# Patient Record
Sex: Female | Born: 1975 | Race: White | Hispanic: No | Marital: Married | Smoking: Never smoker
Health system: Southern US, Community
[De-identification: ages and names within clinical notes are randomized; demographics above are authoritative.]

## PROBLEM LIST (undated history)

## (undated) DIAGNOSIS — M51379 Other intervertebral disc degeneration, lumbosacral region without mention of lumbar back pain or lower extremity pain: Secondary | ICD-10-CM

## (undated) DIAGNOSIS — M5134 Other intervertebral disc degeneration, thoracic region: Secondary | ICD-10-CM

## (undated) DIAGNOSIS — I10 Essential (primary) hypertension: Secondary | ICD-10-CM

## (undated) HISTORY — PX: ABDOMINAL HYSTERECTOMY: SHX81

## (undated) HISTORY — PX: TONSILLECTOMY: SUR1361

---

## 2004-05-20 ENCOUNTER — Ambulatory Visit: Payer: Self-pay | Admitting: Orthopedic Surgery

## 2005-04-10 ENCOUNTER — Emergency Department: Payer: Self-pay | Admitting: General Practice

## 2005-04-17 ENCOUNTER — Ambulatory Visit: Payer: Self-pay

## 2006-12-20 ENCOUNTER — Emergency Department: Payer: Self-pay | Admitting: Emergency Medicine

## 2007-01-13 ENCOUNTER — Ambulatory Visit: Payer: Self-pay | Admitting: Family Medicine

## 2007-01-13 ENCOUNTER — Emergency Department: Payer: Self-pay | Admitting: Emergency Medicine

## 2007-01-14 ENCOUNTER — Emergency Department: Payer: Self-pay | Admitting: Emergency Medicine

## 2007-01-19 ENCOUNTER — Ambulatory Visit: Payer: Self-pay | Admitting: Family Medicine

## 2007-07-31 ENCOUNTER — Other Ambulatory Visit: Payer: Self-pay

## 2007-07-31 ENCOUNTER — Emergency Department: Payer: Self-pay | Admitting: Emergency Medicine

## 2007-11-10 ENCOUNTER — Ambulatory Visit: Payer: Self-pay | Admitting: Family Medicine

## 2007-12-05 ENCOUNTER — Emergency Department: Payer: Self-pay | Admitting: Emergency Medicine

## 2008-03-22 ENCOUNTER — Emergency Department: Payer: Self-pay | Admitting: Emergency Medicine

## 2008-08-27 ENCOUNTER — Emergency Department: Payer: Self-pay | Admitting: Emergency Medicine

## 2008-10-30 ENCOUNTER — Ambulatory Visit: Payer: Self-pay | Admitting: Family Medicine

## 2008-11-28 ENCOUNTER — Ambulatory Visit: Payer: Self-pay | Admitting: Family Medicine

## 2008-11-29 ENCOUNTER — Ambulatory Visit: Payer: Self-pay | Admitting: Family Medicine

## 2009-02-11 ENCOUNTER — Emergency Department: Payer: Self-pay | Admitting: Emergency Medicine

## 2010-01-31 ENCOUNTER — Emergency Department: Payer: Self-pay | Admitting: Internal Medicine

## 2010-02-20 ENCOUNTER — Ambulatory Visit: Payer: Self-pay | Admitting: Family Medicine

## 2010-04-02 ENCOUNTER — Emergency Department: Payer: Self-pay | Admitting: Emergency Medicine

## 2011-06-30 ENCOUNTER — Emergency Department: Payer: Self-pay | Admitting: Unknown Physician Specialty

## 2011-08-18 DIAGNOSIS — N946 Dysmenorrhea, unspecified: Secondary | ICD-10-CM | POA: Insufficient documentation

## 2011-10-27 ENCOUNTER — Encounter: Payer: Self-pay | Admitting: Obstetrics and Gynecology

## 2011-11-12 ENCOUNTER — Encounter: Payer: Self-pay | Admitting: Obstetrics and Gynecology

## 2012-01-02 ENCOUNTER — Emergency Department: Payer: Self-pay | Admitting: Emergency Medicine

## 2012-01-02 LAB — URINALYSIS, COMPLETE
Bilirubin,UR: NEGATIVE
Blood: NEGATIVE
Nitrite: NEGATIVE
Ph: 5 (ref 4.5–8.0)
Specific Gravity: 1.023 (ref 1.003–1.030)
WBC UR: 1 /HPF (ref 0–5)

## 2013-03-15 ENCOUNTER — Emergency Department: Payer: Self-pay | Admitting: Emergency Medicine

## 2015-07-27 DIAGNOSIS — R1013 Epigastric pain: Secondary | ICD-10-CM | POA: Insufficient documentation

## 2016-01-17 DIAGNOSIS — K449 Diaphragmatic hernia without obstruction or gangrene: Secondary | ICD-10-CM | POA: Insufficient documentation

## 2016-01-17 DIAGNOSIS — E7800 Pure hypercholesterolemia, unspecified: Secondary | ICD-10-CM | POA: Insufficient documentation

## 2016-01-17 DIAGNOSIS — I10 Essential (primary) hypertension: Secondary | ICD-10-CM | POA: Insufficient documentation

## 2016-01-17 DIAGNOSIS — K219 Gastro-esophageal reflux disease without esophagitis: Secondary | ICD-10-CM | POA: Insufficient documentation

## 2016-04-20 ENCOUNTER — Encounter: Payer: Self-pay | Admitting: *Deleted

## 2016-04-20 ENCOUNTER — Emergency Department: Payer: 59

## 2016-04-20 ENCOUNTER — Emergency Department
Admission: EM | Admit: 2016-04-20 | Discharge: 2016-04-20 | Disposition: A | Discharge status: Home / Self Care | Payer: 59 | Attending: Emergency Medicine | Admitting: Emergency Medicine

## 2016-04-20 DIAGNOSIS — I10 Essential (primary) hypertension: Secondary | ICD-10-CM | POA: Diagnosis not present

## 2016-04-20 DIAGNOSIS — F17200 Nicotine dependence, unspecified, uncomplicated: Secondary | ICD-10-CM | POA: Diagnosis not present

## 2016-04-20 DIAGNOSIS — M25511 Pain in right shoulder: Secondary | ICD-10-CM | POA: Insufficient documentation

## 2016-04-20 HISTORY — DX: Essential (primary) hypertension: I10

## 2016-04-20 MED ORDER — OXYCODONE-ACETAMINOPHEN 5-325 MG PO TABS: 1.0000 | ORAL_TABLET | ORAL | 0 refills | Status: DC | PRN

## 2016-04-20 MED ORDER — OXYCODONE-ACETAMINOPHEN 5-325 MG PO TABS
1.0000 | ORAL_TABLET | Freq: Once | ORAL | Status: AC
  Administered 2016-04-20: 1 via ORAL
  Filled 2016-04-20: qty 1

## 2016-04-20 MED ORDER — ETODOLAC 400 MG PO TABS: 400.0000 mg | ORAL_TABLET | Freq: Two times a day (BID) | ORAL | 0 refills | Status: DC

## 2016-04-20 MED ORDER — KETOROLAC TROMETHAMINE 30 MG/ML IJ SOLN
30.0000 mg | Freq: Once | INTRAMUSCULAR | Status: AC
  Administered 2016-04-20: 30 mg via INTRAMUSCULAR
  Filled 2016-04-20: qty 1

## 2016-04-20 NOTE — Discharge Instructions (Signed)
Follow-up with your primary care doctor if any continued problems or Dr. Rosita KeaMenz who is the orthopedist on call this weekend. Continue medication as directed. Etodolac 400 mg twice a day with food and Percocet as needed for severe pain. Take ice packs or heat to your shoulder frequently today.

## 2016-04-20 NOTE — ED Notes (Signed)
NAD noted at time of D/C. Pt denies questions or concerns. Pt ambulatory to the lobby at this time.  

## 2016-04-20 NOTE — ED Provider Notes (Signed)
[facility] Emergency Department Provider Note  ____________________________________________   First MD Initiated Contact with Patient 04/20/16 [phone]     (approximate)  I have reviewed the triage vital signs and the nursing notes.   HISTORY  Chief Complaint Shoulder Pain   HPI Tracy Robertson is a 40 y.o. female is complaint of shoulder pain. Patient states that she began having right shoulder pain when she woke up this morning. She states the pain radiates down her right arm and that her fingers feel slightly numb. 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 She denies any injury to her shoulder or problems in the past. She has not taken any over-the-counter medication for this.She denies any neck pain, chest pain, fever, chills, shortness of breath. Range of motion increases her pain. Patient states that holding her arm down decreases it always slightly. Currently she rates her pain as a 6/10.   Past Medical History:  Diagnosis Date  . Hypertension     There are no active problems to display for this patient.   No past surgical history on file.  Prior to Admission medications   Medication Sig Start Date End Date Taking? Authorizing Provider  oxyCODONE-acetaminophen (PERCOCET) 5-325 MG tablet Take 1 tablet by mouth every 4 (four) hours as needed for severe pain. 04/20/16   Tommi Rumpshonda L Venna Berberich, PA-C    Allergies Review of patient's allergies indicates no known allergies.  No family history on file.  Social History Social History  Substance Use Topics  . Smoking status: Never Smoker  . Smokeless tobacco: Current User  . Alcohol use No    Review of Systems Constitutional: No fever/chills ENT: No sore throat. Cardiovascular: Denies chest pain. Respiratory: Denies shortness of breath. Gastrointestinal:   No nausea, no vomiting.  Musculoskeletal: Negative for back pain. Positive right shoulder pain. Skin: Negative for  rash. Neurological: Negative for headaches. Positive for right arm radiculopathy.  10-point ROS otherwise negative.  ____________________________________________   PHYSICAL EXAM:  VITAL SIGNS: ED Triage Vitals  Enc Vitals Group     BP 04/20/16 0939 (!) 153/100     Pulse Rate 04/20/16 0939 87     Resp 04/20/16 0939 18     Temp 04/20/16 0939 98.4 F (36.9 C)     Temp Source 04/20/16 0939 Oral     SpO2 04/20/16 0939 95 %     Weight 04/20/16 0937 200 lb (90.7 kg)     Height 04/20/16 0937 5\' 4"  (1.626 m)     Head Circumference --      Peak Flow --      Pain Score 04/20/16 0937 6     Pain Loc --      Pain Edu? --      Excl. in GC? --     Constitutional: Alert and oriented. Well appearing and in no acute distress. Eyes: Conjunctivae are normal. PERRL. EOMI. Head: Atraumatic. Nose: No congestion/rhinnorhea. Neck: No stridor.  No cervical tenderness on palpation posteriorly. No difficulty with range of motion or restriction. Cardiovascular: Normal rate, regular rhythm. Grossly normal heart sounds.  Good peripheral circulation. Respiratory: Normal respiratory effort.  No retractions. Lungs CTAB. Musculoskeletal: On examination of the right shoulder there is no gross deformity. There is moderate tenderness on palpation of the right trapezius muscle and rhomboid. There is no crepitus noted with range of motion however range of motion is moderately restricted secondary to pain. There is limited abduction secondary to pain. Elbow and wrist is  nontender and no edema was present. Motor sensory function was intact. Neurologic:  Normal speech and language. No gross focal neurologic deficits are appreciated. No gait instability. Skin:  Skin is warm, dry and intact. No rash noted. Psychiatric: Mood and affect are normal. Speech and behavior are normal.  ____________________________________________   LABS (all labs ordered are listed, but only abnormal results are displayed)  Labs Reviewed  - No data to display  RADIOLOGY  Right shoulder x-ray per radiologist is negative for fracture or dislocation. ____________________________________________   PROCEDURES  Procedure(s) performed: None  Procedures  Critical Care performed: No  ____________________________________________   INITIAL IMPRESSION / ASSESSMENT AND PLAN / ED COURSE  Pertinent labs & imaging results that were available during my care of the patient were reviewed by me and considered in my medical decision making (see chart for details).    Clinical Course  Before leaving patient was feeling much better. Patient was given Toradol 30 mg IM in the emergency room along with Percocet. Patient is follow-up with Dr. Rosita Kea or to primary if any continued problems with her shoulder. She is to continue with Percocet as needed for pain. Etodolac was discontinued secondary to patient mentioning that she does have a history of stomach ulcers.   ____________________________________________   FINAL CLINICAL IMPRESSION(S) / ED DIAGNOSES  Final diagnoses:  Acute pain of right shoulder      NEW MEDICATIONS STARTED DURING THIS VISIT:  Discharge Medication List as of 04/20/2016 11:41 AM    START taking these medications   Details  etodolac (LODINE) 400 MG tablet Take 1 tablet (400 mg total) by mouth 2 (two) times daily., Starting Sun 04/20/2016, Print    oxyCODONE-acetaminophen (PERCOCET) 5-325 MG tablet Take 1 tablet by mouth every 4 (four) hours as needed for severe pain., Starting Sun 04/20/2016, Print         Note:  This document was prepared using Dragon voice recognition software and may include unintentional dictation errors.    Tommi Rumps, PA-C 04/20/16 1523    Arnaldo Natal, MD 04/24/16 [phone]

## 2016-04-20 NOTE — ED Triage Notes (Signed)
Pt woke up right shoulder pain radiating down arm, pt denies injury

## 2016-11-25 DIAGNOSIS — R202 Paresthesia of skin: Secondary | ICD-10-CM | POA: Insufficient documentation

## 2016-11-25 DIAGNOSIS — M62838 Other muscle spasm: Secondary | ICD-10-CM | POA: Insufficient documentation

## 2016-11-25 DIAGNOSIS — R200 Anesthesia of skin: Secondary | ICD-10-CM | POA: Insufficient documentation

## 2016-12-22 DIAGNOSIS — E669 Obesity, unspecified: Secondary | ICD-10-CM | POA: Insufficient documentation

## 2016-12-29 ENCOUNTER — Ambulatory Visit
Admission: EM | Admit: 2016-12-29 | Discharge: 2016-12-29 | Disposition: A | Discharge status: Home / Self Care | Payer: 59 | Attending: Emergency Medicine | Admitting: Emergency Medicine

## 2016-12-29 ENCOUNTER — Ambulatory Visit (INDEPENDENT_AMBULATORY_CARE_PROVIDER_SITE_OTHER): Payer: 59

## 2016-12-29 DIAGNOSIS — S8391XA Sprain of unspecified site of right knee, initial encounter: Secondary | ICD-10-CM

## 2016-12-29 DIAGNOSIS — M25561 Pain in right knee: Secondary | ICD-10-CM | POA: Diagnosis not present

## 2016-12-29 DIAGNOSIS — S86911A Strain of unspecified muscle(s) and tendon(s) at lower leg level, right leg, initial encounter: Secondary | ICD-10-CM | POA: Diagnosis not present

## 2016-12-29 NOTE — Discharge Instructions (Signed)
Rest.. Ice. Elevate. Wear splint. Avoid strenuous activity. Stretch. Gradually increase activity as tolerated.    Follow up with your primary care physician or orthopedic this week as needed. Return to Urgent care for new or worsening concerns.

## 2016-12-29 NOTE — ED Triage Notes (Signed)
Patient complains of right knee pain. Patient states that she has been Saturday evening, worsening last night. Patient states that she has been aggressively working out and feels like her knee is popping. Patient states that she has never had knee pain before.

## 2016-12-29 NOTE — ED Provider Notes (Signed)
[facility] ____________________________________________  Time seen: Approximately 6:28 PM  I have reviewed the triage vital signs and the nursing notes.   HISTORY  Chief Complaint Knee Pain (Right)  HPI Tracy Robertson is a 41 y.o. female  presents for evaluation of right knee pain is been present for the last 2-3 days. Patient reports Saturday she felt a slight pain to her knee, but reports pain was definitely present Sunday when she woke up and has continued. Denies any acute worsening today of pain. States pain is focused to the right side of her knee. Denies any fall, direct injury or known injury. States over last week she has increased her physical activity. Reports she has been recently walking but over the last week she has started to run. Patient reports she's been exercising a lot in the last 1-2 weeks. States that she does have some clicking popping sensation to the right knee with active weightbearing. Denies any decreased range of motion, redness, skin changes or insect bite. Denies any posterior knee or calf or lower leg pain. Denies pain radiation, paresthesias or decreased range of motion. Denies previous knee issues. States pain at this time is mild. States that she does take daily Celebrex for chronic neck issues.   Denies chest pain, shortness of breath, abdominal pain, dysuria, or rash. Denies recent sickness. Denies recent antibiotic use.    Past Medical History:  Diagnosis Date  . Hypertension   chronic neck and back pain.  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   Past Surgical History:  Procedure Laterality Date  . ABDOMINAL HYSTERECTOMY    . TONSILLECTOMY       No current facility-administered medications for this encounter.   Current Outpatient Prescriptions:  .  celecoxib (CELEBREX) 200 MG capsule, Take 200 mg by mouth 2 (two) times daily., Disp: , Rfl:  .  losartan (COZAAR) 25 MG  tablet, Take 25 mg by mouth daily., Disp: , Rfl:  .  pantoprazole (PROTONIX) 40 MG tablet, Take 40 mg by mouth 2 (two) times daily., Disp: , Rfl:  .  oxyCODONE-acetaminophen (PERCOCET) 5-325 MG tablet, Take 1 tablet by mouth every 4 (four) hours as needed for severe pain., Disp: 20 tablet, Rfl: 0  Allergies Patient has no known allergies.  History reviewed. No pertinent family history.  Social History Social History  Substance Use Topics  . Smoking status: Never Smoker  . Smokeless tobacco: Never Used  . Alcohol use No    Review of Systems Constitutional: No fever/chills Cardiovascular: Denies chest pain. Respiratory: Denies shortness of breath. Gastrointestinal: No abdominal pain.   Genitourinary: Negative for dysuria. Musculoskeletal: As above.  Skin: Negative for rash.   ____________________________________________   PHYSICAL EXAM:  VITAL SIGNS: ED Triage Vitals  Enc Vitals Group     BP 12/29/16 1709 126/82     Pulse Rate 12/29/16 1709 94     Resp 12/29/16 1709 18     Temp 12/29/16 1709 98.1 F (36.7 C)     Temp Source 12/29/16 1709 Oral     SpO2 12/29/16 1709 99 %     Weight 12/29/16 1705 200 lb (90.7 kg)     Height 12/29/16 1705 5\' 3"  (1.6 m)     Head Circumference --      Peak Flow --      Pain Score 12/29/16 1705 6     Pain Loc --      Pain Edu? --  Excl. in GC? --     Constitutional: Alert and oriented. Well appearing and in no acute distress. Cardiovascular: Normal rate, regular rhythm. Grossly normal heart sounds.  Good peripheral circulation. Respiratory: Normal respiratory effort without tachypnea nor retractions. Breath sounds are clear and equal bilaterally. No wheezes, rales, rhonchi. Musculoskeletal: Ambulate her with steady gait. Bilateral posterior tibialis pulses equal and easily palpated. Right anterior lateral knee mild tenderness to direct palpation, right knee with full range of motion present, mild pain with medial and lateral stress,  no pain with anterior posterior drawer test, no pain with resisted knee extension or flexion, no posterior knee tenderness, no calf tenderness, negative Homans sign, skin intact, no erythema or ecchymosis, minimal edema noted to right anterior lateral knee. Neurologic:  Normal speech and language.  Speech is normal. No gait instability.  Skin:  Skin is warm, dry. Psychiatric: Mood and affect are normal. Speech and behavior are normal. Patient exhibits appropriate insight and judgment   ___________________________________________   LABS (all labs ordered are listed, but only abnormal results are displayed)  Labs Reviewed - No data to display ____________________________________________   Radiology  CLINICAL DATA:  Pain for several days  EXAM: RIGHT KNEE - COMPLETE 4+ VIEW  COMPARISON:  None.  FINDINGS: Frontal, lateral, and bilateral oblique views were obtained. There is no appreciable fracture or dislocation. No appreciable joint effusion. Joint spaces appear normal. No erosive change.  IMPRESSION: No fracture or joint effusion.  No appreciable arthropathy.   Electronically Signed   By: Bretta Bang III M.D.   On: 12/29/2016 18:53  PROCEDURES Procedures   INITIAL IMPRESSION / ASSESSMENT AND PLAN / ED COURSE  Pertinent labs & imaging results that were available during my care of the patient were reviewed by me and considered in my medical decision making (see chart for details).  Well appearing. No acute distress. Right lateral knee pain, suspect right lateral knee strain due to recent increase in physical activity. Right knee x-ray per radiologist negative. Encouraged rest, ice, elevation, stretching and gradual increase activity as tolerated. Knee splint with side bars applied by RN. Again encouraged stretch and active range of motion exercises. Patient to continue taking Celebrex at home. Denies need for additional prescription medications. Follow with primary  care physician or orthopedic as needed for continued pain.  Discussed follow up with Primary care physician this week. Discussed follow up and return parameters including no resolution or any worsening concerns. Patient verbalized understanding and agreed to plan.   ____________________________________________   FINAL CLINICAL IMPRESSION(S) / ED DIAGNOSES  Final diagnoses:  Acute pain of right knee  Knee strain, right, initial encounter     New Prescriptions   No medications on file    Note: This dictation was prepared with Dragon dictation along with smaller phrase technology. Any transcriptional errors that result from this process are unintentional.         Renford Dills, NP 12/29/16 1904

## 2017-08-04 ENCOUNTER — Other Ambulatory Visit: Payer: Self-pay | Admitting: Family Medicine

## 2017-08-04 ENCOUNTER — Ambulatory Visit
Admission: RE | Admit: 2017-08-04 | Discharge: 2017-08-04 | Disposition: A | Discharge status: Home / Self Care | Payer: 59 | Source: Ambulatory Visit | Attending: Family Medicine | Admitting: Family Medicine

## 2017-08-04 DIAGNOSIS — R109 Unspecified abdominal pain: Secondary | ICD-10-CM

## 2017-08-04 DIAGNOSIS — K5730 Diverticulosis of large intestine without perforation or abscess without bleeding: Secondary | ICD-10-CM | POA: Insufficient documentation

## 2017-08-04 MED ORDER — IOPAMIDOL (ISOVUE-300) INJECTION 61%
100.0000 mL | Freq: Once | INTRAVENOUS | Status: AC | PRN
  Administered 2017-08-04: 100 mL via INTRAVENOUS

## 2017-09-08 DIAGNOSIS — M5137 Other intervertebral disc degeneration, lumbosacral region: Secondary | ICD-10-CM | POA: Insufficient documentation

## 2017-10-14 ENCOUNTER — Other Ambulatory Visit: Payer: Self-pay | Admitting: Orthopedic Surgery

## 2017-10-14 DIAGNOSIS — M545 Low back pain: Secondary | ICD-10-CM

## 2017-10-22 ENCOUNTER — Ambulatory Visit
Admission: RE | Admit: 2017-10-22 | Discharge: 2017-10-22 | Disposition: A | Discharge status: Home / Self Care | Payer: 59 | Source: Ambulatory Visit | Attending: Orthopedic Surgery | Admitting: Orthopedic Surgery

## 2017-10-22 DIAGNOSIS — M4807 Spinal stenosis, lumbosacral region: Secondary | ICD-10-CM | POA: Diagnosis not present

## 2017-10-22 DIAGNOSIS — M5136 Other intervertebral disc degeneration, lumbar region: Secondary | ICD-10-CM | POA: Diagnosis not present

## 2017-10-22 DIAGNOSIS — M47896 Other spondylosis, lumbar region: Secondary | ICD-10-CM | POA: Insufficient documentation

## 2017-10-22 DIAGNOSIS — M47897 Other spondylosis, lumbosacral region: Secondary | ICD-10-CM | POA: Diagnosis not present

## 2017-10-22 DIAGNOSIS — M5416 Radiculopathy, lumbar region: Secondary | ICD-10-CM | POA: Diagnosis not present

## 2017-10-22 DIAGNOSIS — M545 Low back pain: Secondary | ICD-10-CM | POA: Diagnosis present

## 2017-10-28 ENCOUNTER — Other Ambulatory Visit: Payer: Self-pay | Admitting: Physical Medicine and Rehabilitation

## 2017-10-28 DIAGNOSIS — M5414 Radiculopathy, thoracic region: Secondary | ICD-10-CM

## 2017-11-05 ENCOUNTER — Ambulatory Visit
Admission: RE | Admit: 2017-11-05 | Discharge: 2017-11-05 | Disposition: A | Discharge status: Home / Self Care | Payer: 59 | Source: Ambulatory Visit | Attending: Physical Medicine and Rehabilitation | Admitting: Physical Medicine and Rehabilitation

## 2017-11-05 DIAGNOSIS — M5114 Intervertebral disc disorders with radiculopathy, thoracic region: Secondary | ICD-10-CM | POA: Insufficient documentation

## 2017-11-05 DIAGNOSIS — M5414 Radiculopathy, thoracic region: Secondary | ICD-10-CM | POA: Diagnosis present

## 2017-12-14 ENCOUNTER — Other Ambulatory Visit: Payer: Self-pay | Admitting: Physical Medicine and Rehabilitation

## 2017-12-14 DIAGNOSIS — M5030 Other cervical disc degeneration, unspecified cervical region: Secondary | ICD-10-CM

## 2017-12-14 DIAGNOSIS — M5416 Radiculopathy, lumbar region: Secondary | ICD-10-CM

## 2017-12-14 DIAGNOSIS — M5414 Radiculopathy, thoracic region: Secondary | ICD-10-CM

## 2017-12-24 ENCOUNTER — Ambulatory Visit
Admission: RE | Admit: 2017-12-24 | Discharge: 2017-12-24 | Disposition: A | Discharge status: Home / Self Care | Payer: 59 | Source: Ambulatory Visit | Attending: Physical Medicine and Rehabilitation | Admitting: Physical Medicine and Rehabilitation

## 2017-12-24 DIAGNOSIS — M5414 Radiculopathy, thoracic region: Secondary | ICD-10-CM | POA: Diagnosis not present

## 2017-12-24 DIAGNOSIS — M5416 Radiculopathy, lumbar region: Secondary | ICD-10-CM | POA: Insufficient documentation

## 2017-12-24 DIAGNOSIS — M5030 Other cervical disc degeneration, unspecified cervical region: Secondary | ICD-10-CM | POA: Diagnosis present

## 2018-05-18 DIAGNOSIS — M549 Dorsalgia, unspecified: Secondary | ICD-10-CM | POA: Insufficient documentation

## 2018-05-18 DIAGNOSIS — G8929 Other chronic pain: Secondary | ICD-10-CM | POA: Insufficient documentation

## 2018-06-22 DIAGNOSIS — M2550 Pain in unspecified joint: Secondary | ICD-10-CM | POA: Insufficient documentation

## 2018-06-22 DIAGNOSIS — G894 Chronic pain syndrome: Secondary | ICD-10-CM | POA: Insufficient documentation

## 2018-08-26 DIAGNOSIS — M47816 Spondylosis without myelopathy or radiculopathy, lumbar region: Secondary | ICD-10-CM | POA: Insufficient documentation

## 2019-02-04 DIAGNOSIS — G5603 Carpal tunnel syndrome, bilateral upper limbs: Secondary | ICD-10-CM | POA: Insufficient documentation

## 2019-06-24 DIAGNOSIS — M5481 Occipital neuralgia: Secondary | ICD-10-CM | POA: Insufficient documentation

## 2019-10-25 DIAGNOSIS — Z0289 Encounter for other administrative examinations: Secondary | ICD-10-CM | POA: Insufficient documentation

## 2020-05-15 ENCOUNTER — Ambulatory Visit (INDEPENDENT_AMBULATORY_CARE_PROVIDER_SITE_OTHER): Payer: 59

## 2020-05-15 ENCOUNTER — Encounter: Payer: Self-pay | Admitting: Podiatry

## 2020-05-15 ENCOUNTER — Other Ambulatory Visit: Payer: Self-pay

## 2020-05-15 ENCOUNTER — Ambulatory Visit (INDEPENDENT_AMBULATORY_CARE_PROVIDER_SITE_OTHER): Payer: 59 | Admitting: Podiatry

## 2020-05-15 DIAGNOSIS — M7672 Peroneal tendinitis, left leg: Secondary | ICD-10-CM

## 2020-05-15 DIAGNOSIS — M7661 Achilles tendinitis, right leg: Secondary | ICD-10-CM

## 2020-05-15 DIAGNOSIS — M7662 Achilles tendinitis, left leg: Secondary | ICD-10-CM | POA: Diagnosis not present

## 2020-05-15 DIAGNOSIS — M7660 Achilles tendinitis, unspecified leg: Secondary | ICD-10-CM

## 2020-05-15 DIAGNOSIS — J4520 Mild intermittent asthma, uncomplicated: Secondary | ICD-10-CM | POA: Insufficient documentation

## 2020-05-16 ENCOUNTER — Encounter: Payer: Self-pay | Admitting: Podiatry

## 2020-05-16 NOTE — Progress Notes (Signed)
Subjective:  Patient ID: Tracy Robertson, female    DOB: 10-15-1975,  MRN: [id number]  Chief Complaint  Patient presents with  . Ankle Pain    Patient presents today for bilat ankle/heel pain x 2-3 months    44 y.o. female presents with the above complaint.  Patient presents with complaint bilateral foot and ankle pain with pain to the palpation to the right posterior ankle as well as medial lateral ankle.  Patient also has pain to the lateral ankle on the left side.  She states that this has been going on for 2 to 3 months has progressive gotten worse.  The right side is worse than left side.  Is worse in the morning and standing and throbs at night.  Patient has tried ibuprofen Tylenol Voltaren gel heat none of that has helped.  She is a self-employed and is on her foot constantly.  She denies any other acute complaints.  She would like to discuss treatment options she has not seen anyone else prior to seeing me for this.   Review of Systems: Negative except as noted in the HPI. Denies N/V/F/Ch.  Past Medical History:  Diagnosis Date  . Hypertension     Current Outpatient Medications:  .  albuterol (VENTOLIN HFA) 108 (90 Base) MCG/ACT inhaler, Inhale into the lungs., Disp: , Rfl:  .  DULoxetine (CYMBALTA) 60 MG capsule, Take by mouth., Disp: , Rfl:  .  fluticasone (FLONASE) 50 MCG/ACT nasal spray, Place into the nose., Disp: , Rfl:  .  ibuprofen (ADVIL) 800 MG tablet, Take 1 tablet by mouth every 8 (eight) hours as needed., Disp: , Rfl:  .  topiramate (TOPAMAX) 25 MG tablet, , Disp: , Rfl:  .  tretinoin (RETIN-A) 0.05 % cream, APPLY NIGHTLY TO SCAP AS DIRECTED, Disp: , Rfl:  .  acetaminophen (TYLENOL) 500 MG tablet, Take by mouth., Disp: , Rfl:  .  HYDROcodone-acetaminophen (NORCO/VICODIN) 5-325 MG tablet, Take by mouth., Disp: , Rfl:  .  losartan (COZAAR) 25 MG tablet, Take 25 mg by mouth daily., Disp: , Rfl:  .  pantoprazole (PROTONIX) 40 MG tablet, Take 40 mg by mouth 2 (two)  times daily., Disp: , Rfl:  .  spironolactone (ALDACTONE) 25 MG tablet, Take 25 mg by mouth daily., Disp: , Rfl:   Social History   Tobacco Use  Smoking Status Never Smoker  Smokeless Tobacco Never Used    No Known Allergies Objective:  There were no vitals filed for this visit. There is no height or weight on file to calculate BMI. Constitutional Well developed. Well nourished.  Vascular Dorsalis pedis pulses palpable bilaterally. Posterior tibial pulses palpable bilaterally. Capillary refill normal to all digits.  No cyanosis or clubbing noted. Pedal hair growth normal.  Neurologic Normal speech. Oriented to person, place, and time. Epicritic sensation to light touch grossly present bilaterally.  Dermatologic Nails well groomed and normal in appearance. No open wounds. No skin lesions.  Orthopedic:  Pain on palpation to the posterior insertion of the Achilles tendon.  Pain with dorsiflexion of the ankle joint.  No intra-articular ankle joint pain noted.  Pain on palpation along the course of the peroneal tendon as well as posterior tibial without pain at the insertion.  Pain with resisted inversion eversion dorsiflexion plantarflexion of the foot active and passive.  Pain on palpation along the course of the peroneal tendon including the insertion on the left side.  Pain on dorsiflexion eversion resisted.   Radiographs: 3 views of skeletally  mature adult bilateral foot: No osseous abnormalities noted.  No osteoarthritic changes noted.  Soft tissue edema/volume within normal limits Assessment:   1. Achilles tendinitis, unspecified laterality    Plan:  Patient was evaluated and treated and all questions answered.  Right Achilles tendinitis/peroneal tendinitis//posterior tibial tendinitis -I discussed with the patient in extensive detail the etiology of Achilles tendinitis peroneal tendinitis and posterior tibial tendinitis and various treatment options were discussed.  Even  though patient does not recall which foot started first or where the pain originally started given that it has been couple of months, I believe patient will benefit from aggressive immobilization to allow the soft tissue to heal appropriately.  At this time I am hoping that her pain will be more focalized after allowing to be immobilized in the cam boot.  Patient agrees with the plan would like to proceed with a cam boot to the right foot. -Cam boot was dispensed  Left peroneal tendinitis -I explained to the patient the etiology of tendinitis likely due to compensating mechanism to her from the right side and various treatment options were discussed.  I believe patient will benefit from a Tri-Lock ankle brace that she is being immobilized with a boot to the right side.  I discussed with the patient in extensive detail about shoe gear modification as well.  Patient states understanding -Tri-Lock ankle brace was dispensed  No follow-ups on file.

## 2020-06-12 ENCOUNTER — Ambulatory Visit (INDEPENDENT_AMBULATORY_CARE_PROVIDER_SITE_OTHER): Payer: 59 | Admitting: Podiatry

## 2020-06-12 ENCOUNTER — Other Ambulatory Visit: Payer: Self-pay | Admitting: Podiatry

## 2020-06-12 ENCOUNTER — Other Ambulatory Visit: Payer: Self-pay

## 2020-06-12 DIAGNOSIS — M79605 Pain in left leg: Secondary | ICD-10-CM

## 2020-06-12 DIAGNOSIS — M79604 Pain in right leg: Secondary | ICD-10-CM

## 2020-06-12 DIAGNOSIS — M7672 Peroneal tendinitis, left leg: Secondary | ICD-10-CM

## 2020-06-12 DIAGNOSIS — M79672 Pain in left foot: Secondary | ICD-10-CM

## 2020-06-12 DIAGNOSIS — M722 Plantar fascial fibromatosis: Secondary | ICD-10-CM | POA: Diagnosis not present

## 2020-06-12 DIAGNOSIS — M7661 Achilles tendinitis, right leg: Secondary | ICD-10-CM

## 2020-06-12 DIAGNOSIS — M7750 Other enthesopathy of unspecified foot: Secondary | ICD-10-CM

## 2020-06-12 DIAGNOSIS — M79671 Pain in right foot: Secondary | ICD-10-CM

## 2020-06-12 DIAGNOSIS — M7662 Achilles tendinitis, left leg: Secondary | ICD-10-CM

## 2020-06-12 MED ORDER — MELOXICAM 15 MG PO TABS: 15.0000 mg | ORAL_TABLET | Freq: Every day | ORAL | 1 refills | Status: DC

## 2020-06-16 NOTE — Progress Notes (Signed)
   HPI: 44 y.o. female presenting today for follow-up evaluation of chronic severe bilateral lower extremity pain.  Patient states that she has having pain throughout her entire feet and ankles and legs.  She was last seen in the office on 01/13/2020 and a cam boot was dispensed for the right lower extremity as well as a an ankle brace to the left lower extremity.  Patient states that she has been wearing the bracing and boot which did not help.  She continues to have severe pain.  She recently went to the emergency department for evaluation regarding the foot and ankle pain and a Medrol Dosepak was prescribed.  She completes the Dosepak today.  She presents for further treatment and evaluation  Past Medical History:  Diagnosis Date  . Hypertension      Physical Exam: General: The patient is alert and oriented x3 in no acute distress.  Dermatology: Skin is warm, dry and supple bilateral lower extremities. Negative for open lesions or macerations.  Vascular: Palpable pedal pulses bilaterally. No edema or erythema noted. Capillary refill within normal limits.  Neurological: Epicritic and protective threshold grossly intact bilaterally.   Musculoskeletal Exam: Range of motion within normal limits to all pedal and ankle joints bilateral. Muscle strength 5/5 in all groups bilateral.  Today there is pain on palpation throughout the plantar fascia bilateral, and ankle joints, medial lateral and anterior aspects.  And along the Achilles tendon bilateral.  Diffuse pain throughout the entire foot ankle and legs bilateral lower extremities.    Assessment: 1.  Acute inflammatory generalized foot, ankle, and leg pain bilateral lower extremities 2.  Plantar fasciitis bilateral 3.  Achilles tendinitis bilateral 4.  Ankle joint capsulitis bilateral   Plan of Care:  1. Patient evaluated.  2.  Discontinue the cam boot right lower extremity and ankle brace left. 3.  Compression ankle sleeve dispensed.  Wear  daily bilateral lower extremities 4.  Patient finishes the Medrol Dosepak from the ED today. 5.  Prescription for meloxicam 15 mg daily 6.  Order placed for physical therapy at [facility] 7.  Injection of 0.5 cc Celestone Soluspan injected along the plantar fascia right, and Achilles tendon left 8.  Return to clinic in 4 weeks  *Husband's name is "G". She is a Interior and spatial designer.       Felecia Shelling, DPM [facility]  Dr. Felecia Shelling, DPM    [address] [place], [place] [address]                Office [phone]  Fax [phone]

## 2020-07-10 ENCOUNTER — Ambulatory Visit: Payer: 59 | Admitting: Podiatry

## 2020-07-15 ENCOUNTER — Other Ambulatory Visit: Payer: Self-pay | Admitting: Podiatry

## 2020-07-22 NOTE — Telephone Encounter (Signed)
Please advise 

## 2020-10-23 ENCOUNTER — Other Ambulatory Visit: Payer: Self-pay | Admitting: Podiatry

## 2020-10-23 NOTE — Telephone Encounter (Signed)
Please advise 

## 2021-10-15 ENCOUNTER — Ambulatory Visit: Payer: 59 | Admitting: Podiatry

## 2021-10-22 ENCOUNTER — Ambulatory Visit: Payer: 59 | Admitting: Podiatry

## 2021-10-24 ENCOUNTER — Encounter: Payer: Self-pay | Admitting: Podiatry

## 2021-10-24 ENCOUNTER — Ambulatory Visit (INDEPENDENT_AMBULATORY_CARE_PROVIDER_SITE_OTHER): Payer: 59 | Admitting: Podiatry

## 2021-10-24 DIAGNOSIS — M7752 Other enthesopathy of left foot: Secondary | ICD-10-CM

## 2021-10-24 DIAGNOSIS — M7751 Other enthesopathy of right foot: Secondary | ICD-10-CM

## 2021-10-26 ENCOUNTER — Emergency Department
Admission: EM | Admit: 2021-10-26 | Discharge: 2021-10-26 | Disposition: A | Discharge status: Home / Self Care | Payer: 59 | Attending: Emergency Medicine | Admitting: Emergency Medicine

## 2021-10-26 DIAGNOSIS — M545 Low back pain, unspecified: Secondary | ICD-10-CM | POA: Diagnosis present

## 2021-10-26 DIAGNOSIS — I10 Essential (primary) hypertension: Secondary | ICD-10-CM | POA: Diagnosis not present

## 2021-10-26 DIAGNOSIS — B029 Zoster without complications: Secondary | ICD-10-CM | POA: Diagnosis not present

## 2021-10-26 MED ORDER — ACYCLOVIR 400 MG PO TABS: 800.0000 mg | ORAL_TABLET | Freq: Every day | ORAL | 0 refills | Status: DC

## 2021-10-26 MED ORDER — TRIAMCINOLONE ACETONIDE 0.1 % EX OINT: 1.0000 "application " | TOPICAL_OINTMENT | Freq: Two times a day (BID) | CUTANEOUS | 1 refills | Status: DC

## 2021-10-26 MED ORDER — ACYCLOVIR 200 MG PO CAPS
800.0000 mg | ORAL_CAPSULE | Freq: Once | ORAL | Status: AC
  Administered 2021-10-26: 800 mg via ORAL
  Filled 2021-10-26: qty 4

## 2021-10-26 MED ORDER — LIDOCAINE 5 % EX PTCH
1.0000 | MEDICATED_PATCH | CUTANEOUS | Status: DC
  Administered 2021-10-26: 1 via TRANSDERMAL
  Filled 2021-10-26: qty 1

## 2021-10-26 MED ORDER — PREDNISONE 20 MG PO TABS
60.0000 mg | ORAL_TABLET | Freq: Once | ORAL | Status: AC
  Administered 2021-10-26: 60 mg via ORAL
  Filled 2021-10-26: qty 3

## 2021-10-26 MED ORDER — HYDROCODONE-ACETAMINOPHEN 5-325 MG PO TABS: 1.0000 | ORAL_TABLET | Freq: Three times a day (TID) | ORAL | 0 refills | Status: AC | PRN

## 2021-10-26 MED ORDER — HYDROCODONE-ACETAMINOPHEN 5-325 MG PO TABS
1.0000 | ORAL_TABLET | Freq: Once | ORAL | Status: AC
  Administered 2021-10-26: 1 via ORAL
  Filled 2021-10-26: qty 1

## 2021-10-26 MED ORDER — PREDNISONE 20 MG PO TABS: 40.0000 mg | ORAL_TABLET | Freq: Every day | ORAL | 0 refills | Status: AC

## 2021-10-26 NOTE — ED Triage Notes (Signed)
Patient to ER via [facility] with complaints of left sided lower back pain with radiation to hip. Reports pain started on Monday. Denies urinary symptoms. Denies any known injury. Denies numbness or tingling.  ?

## 2021-10-26 NOTE — Discharge Instructions (Addendum)
The antiviral medicine along with the pain medicine, steroid, and topical pain agent as needed.  Follow-up primary provider for ongoing symptoms. ?

## 2021-10-26 NOTE — ED Provider Notes (Signed)
? ? ?[facility] ?Emergency Department Provider Note ? ? ? ? Event Date/Time  ? First MD Initiated Contact with Patient 10/26/21 1227   ?  (approximate) ? ? ?History  ? ?Back Pain ? ? ?HPI ? ?Tracy Robertson is a 46 y.o. female with a history of hypertension, DDD and chronic pain syndrome, presents to the ED for evaluation of left-sided low back pain.  Patient reports the onset of symptoms on Monday.  She denies any urinary symptoms, recent injury, trauma, fall.  She does report that the pain is sharp and intermittent and feels like it is in her skin.  She denies any pain with range of motion denies any other aggravating factors.  She notes that the pain is unlike her musculoskeletal pain. ? ? ?Physical Exam  ? ?Triage Vital Signs: ?ED Triage Vitals  ?Enc Vitals Group  ?   BP 10/26/21 1131 (!) 125/91  ?   Pulse Rate 10/26/21 1131 83  ?   Resp 10/26/21 1131 18  ?   Temp 10/26/21 1131 98.4 ?F (36.9 ?C)  ?   Temp Source 10/26/21 1131 Oral  ?   SpO2 10/26/21 1131 99 %  ?   Weight 10/26/21 1130 196 lb (88.9 kg)  ?   Height 10/26/21 1130 5\' 3"  (1.6 m)  ?   Head Circumference --   ?   Peak Flow --   ?   Pain Score 10/26/21 1130 9  ?   Pain Loc --   ?   Pain Edu? --   ?   Excl. in GC? --   ? ? ?Most recent vital signs: ?Vitals:  ? 10/26/21 1131  ?BP: (!) 125/91  ?Pulse: 83  ?Resp: 18  ?Temp: 98.4 ?F (36.9 ?C)  ?SpO2: 99%  ? ? ?General Awake, no distress.  ?CV:  Good peripheral perfusion.  ?RESP:  Normal effort. CTA ?ABD:  No distention.  ?MSK:  Normal spinal alignment without midline tenderness, spasm, deformity, or step-off.  Full active range of motion noted to the bilateral upper and lower extremities. ?SKIN:  Left low back pain with some scattered vesicular lesions noted in the area of concern.  The lesions appear to approach the midline but do not cross the midline.  A single dermatome appears to be affected. ? ? ?ED Results / Procedures / Treatments  ? ?Labs ?(all labs ordered are listed, but  only abnormal results are displayed) ?Labs Reviewed - No data to display ? ? ?EKG ? ? ?RADIOLOGY ? ? ?No results found. ? ? ?PROCEDURES: ? ?Critical Care performed: No ? ?Procedures ? ? ?MEDICATIONS ORDERED IN ED: ?Medications  ?lidocaine (LIDODERM) 5 % 1 patch (1 patch Transdermal Patch Applied 10/26/21 1325)  ?predniSONE (DELTASONE) tablet 60 mg (60 mg Oral Given 10/26/21 1324)  ?acyclovir (ZOVIRAX) 200 MG capsule 800 mg (800 mg Oral Given 10/26/21 1350)  ?HYDROcodone-acetaminophen (NORCO/VICODIN) 5-325 MG per tablet 1 tablet (1 tablet Oral Given 10/26/21 1324)  ? ? ? ?IMPRESSION / MDM / ASSESSMENT AND PLAN / ED COURSE  ?I reviewed the triage vital signs and the nursing notes. ?             ?               ? ?Differential diagnosis includes, but is not limited to, lumbar strain, UTI, nephrolithiasis, chondrodermatitis, muscle spasm, herpes zoster ? ?Patient to the ED for evaluation of sharp intermittent pain to the left low back for several days.  Patient presents in no acute distress with no red flags on exam.  No acute neuromuscular process appreciated.  Patient with some local skin touches the area of concern consistent with some scattered vesicular eruptions in a single dermatome, without extension across the midline.  Patient's diagnosis is consistent with herpes zoster. Patient will be discharged home with prescriptions for acyclovir, prednisone, triamcinolone, hydrocodone. Patient is to follow up with primary provider as needed or otherwise directed. Patient is given ED precautions to return to the ED for any worsening or new symptoms. ? ? ?FINAL CLINICAL IMPRESSION(S) / ED DIAGNOSES  ? ?Final diagnoses:  ?Herpes zoster without complication  ? ? ? ?Rx / DC Orders  ? ?ED Discharge Orders   ? ?      Ordered  ?  acyclovir (ZOVIRAX) 400 MG tablet  5 times daily,   Status:  Discontinued       ? 10/26/21 1325  ?  predniSONE (DELTASONE) 20 MG tablet  Daily with breakfast       ? 10/26/21 1325  ?  triamcinolone  ointment (KENALOG) 0.1 %  2 times daily       ? 10/26/21 1325  ?  HYDROcodone-acetaminophen (NORCO) 5-325 MG tablet  3 times daily PRN       ? 10/26/21 1325  ?  acyclovir (ZOVIRAX) 400 MG tablet  5 times daily       ? 10/26/21 1332  ? ?  ?  ? ?  ? ? ? ?Note:  This document was prepared using Dragon voice recognition software and may include unintentional dictation errors. ? ?  ?Lissa Hoard, PA-C ?10/26/21 1449 ? ?  ?Arnaldo Natal, MD ?10/26/21 1527 ? ?

## 2021-10-26 NOTE — ED Notes (Signed)
Pt to ED with partner, pt complains of mid L back pain on back that wraps around to L side and down to L hip. Describes pain as burning and sharp.  ?Hx DDD and spinal stenosis and states that this pain is different. ?

## 2021-10-26 NOTE — ED Notes (Signed)
Sent med message for acyclovir. ?

## 2021-10-29 ENCOUNTER — Encounter: Payer: Self-pay | Admitting: Podiatry

## 2021-10-29 NOTE — Progress Notes (Signed)
?Subjective:  ?Patient ID: Tracy Robertson, female    DOB: [birth year],  MRN: [id number] ? ?Chief Complaint  ?Patient presents with  ? Foot Pain  ? ? ?46 y.o. female presents with the above complaint.  Patient presents with primary complaint of right ankle pain as well as bilateral second metatarsophalangeal joint pain.  She states it hurts with ambulation has progressed gotten worse.  She is seeing Dr. Logan Bores 2 years ago.  She states the injection helped considerably.  She would like to know if she can just do injections.  She has not seen anyone else prior to seeing me for this.  She denies any other acute complaints. ? ? ?Review of Systems: Negative except as noted in the HPI. Denies N/V/F/Ch. ? ?Past Medical History:  ?Diagnosis Date  ? Hypertension   ? ? ?Current Outpatient Medications:  ?  acyclovir (ZOVIRAX) 400 MG tablet, Take 2 tablets (800 mg total) by mouth 5 (five) times daily., Disp: 70 tablet, Rfl: 0 ?  albuterol (VENTOLIN HFA) 108 (90 Base) MCG/ACT inhaler, Inhale into the lungs., Disp: , Rfl:  ?  DULoxetine (CYMBALTA) 60 MG capsule, Take by mouth., Disp: , Rfl:  ?  fluticasone (FLONASE) 50 MCG/ACT nasal spray, Place into the nose., Disp: , Rfl:  ?  HYDROcodone-acetaminophen (NORCO) 5-325 MG tablet, Take 1 tablet by mouth 3 (three) times daily as needed for up to 4 days., Disp: 12 tablet, Rfl: 0 ?  losartan (COZAAR) 25 MG tablet, Take 25 mg by mouth daily., Disp: , Rfl:  ?  meloxicam (MOBIC) 15 MG tablet, TAKE 1 TABLET(15 MG) BY MOUTH DAILY, Disp: 90 tablet, Rfl: 0 ?  metroNIDAZOLE (FLAGYL) 500 MG tablet, Take 500 mg by mouth 2 (two) times daily., Disp: , Rfl:  ?  pantoprazole (PROTONIX) 40 MG tablet, Take 40 mg by mouth 2 (two) times daily., Disp: , Rfl:  ?  predniSONE (DELTASONE) 20 MG tablet, Take 2 tablets (40 mg total) by mouth daily with breakfast for 5 days., Disp: 10 tablet, Rfl: 0 ?  spironolactone (ALDACTONE) 25 MG tablet, Take 25 mg by mouth daily., Disp: , Rfl:  ?  topiramate (TOPAMAX) 25  MG tablet, , Disp: , Rfl:  ?  tretinoin (RETIN-A) 0.05 % cream, APPLY NIGHTLY TO SCAP AS DIRECTED, Disp: , Rfl:  ?  triamcinolone ointment (KENALOG) 0.1 %, Apply 1 application. topically 2 (two) times daily., Disp: 30 g, Rfl: 1 ? ?Social History  ? ?Tobacco Use  ?Smoking Status Never  ?Smokeless Tobacco Never  ? ? ?No Known Allergies ?Objective:  ?There were no vitals filed for this visit. ?There is no height or weight on file to calculate BMI. ?Constitutional Well developed. ?Well nourished.  ?Vascular Dorsalis pedis pulses palpable bilaterally. ?Posterior tibial pulses palpable bilaterally. ?Capillary refill normal to all digits.  ?No cyanosis or clubbing noted. ?Pedal hair growth normal.  ?Neurologic Normal speech. ?Oriented to person, place, and time. ?Epicritic sensation to light touch grossly present bilaterally.  ?Dermatologic Nails well groomed and normal in appearance. ?No open wounds. ?No skin lesions.  ?Orthopedic: Pain on palpation of right ankle joint.  Pain with range of motion of the ankle joint.  No deep intra-articular pain noted.  No pain at the Achilles tendon, peroneal tendon, ATFL ligament. ? ?Pain on palpation bilateral second metatarsophalangeal joint especially on the plantar side.  No pain on the dorsal side of the joint.  No pain with range of motion second metatarsophalangeal joint.  No deep intra-articular pain noted.  ? ?  Radiographs: None ?Assessment:  ? ?1. Capsulitis of right ankle   ?2. Capsulitis of metatarsophalangeal (MTP) joint of right foot   ?3. Capsulitis of metatarsophalangeal (MTP) joint of left foot   ? ?Plan:  ?Patient was evaluated and treated and all questions answered. ? ?Bilateral second metatarsophalangeal joint/ankle joint capsulitis ?-All questions and concerns were discussed with the patient extensive detail.  Given the amount of pain that she is experiencing I believe she will benefit from steroid injection of decrease pain inflammatory component associate with  pain.  Patient agrees with plan like to proceed with steroid injection. ?-A steroid injection was performed at Bilateral second metatarsophalangeal joint and right ankle joint using 1% plain Lidocaine and 10 mg of Kenalog. This was well tolerated. ? ? ?No follow-ups on file.  ?

## 2021-11-08 ENCOUNTER — Emergency Department
Admission: EM | Admit: 2021-11-08 | Discharge: 2021-11-08 | Disposition: A | Discharge status: Home / Self Care | Payer: 59 | Attending: Emergency Medicine | Admitting: Emergency Medicine

## 2021-11-08 ENCOUNTER — Other Ambulatory Visit: Payer: Self-pay

## 2021-11-08 ENCOUNTER — Emergency Department: Payer: 59

## 2021-11-08 DIAGNOSIS — H1033 Unspecified acute conjunctivitis, bilateral: Secondary | ICD-10-CM | POA: Diagnosis not present

## 2021-11-08 DIAGNOSIS — J45909 Unspecified asthma, uncomplicated: Secondary | ICD-10-CM | POA: Diagnosis not present

## 2021-11-08 DIAGNOSIS — K5792 Diverticulitis of intestine, part unspecified, without perforation or abscess without bleeding: Secondary | ICD-10-CM | POA: Diagnosis not present

## 2021-11-08 DIAGNOSIS — R748 Abnormal levels of other serum enzymes: Secondary | ICD-10-CM | POA: Insufficient documentation

## 2021-11-08 DIAGNOSIS — H109 Unspecified conjunctivitis: Secondary | ICD-10-CM

## 2021-11-08 DIAGNOSIS — R1032 Left lower quadrant pain: Secondary | ICD-10-CM | POA: Diagnosis present

## 2021-11-08 DIAGNOSIS — I10 Essential (primary) hypertension: Secondary | ICD-10-CM | POA: Insufficient documentation

## 2021-11-08 LAB — URINALYSIS, ROUTINE W REFLEX MICROSCOPIC
Bilirubin Urine: NEGATIVE
Glucose, UA: NEGATIVE mg/dL
Hgb urine dipstick: NEGATIVE
Ketones, ur: NEGATIVE mg/dL
Leukocytes,Ua: NEGATIVE
Nitrite: NEGATIVE
Protein, ur: NEGATIVE mg/dL
Specific Gravity, Urine: 1.027 (ref 1.005–1.030)
pH: 5 (ref 5.0–8.0)

## 2021-11-08 LAB — CBC WITH DIFFERENTIAL/PLATELET
Abs Immature Granulocytes: 0.03 10*3/uL (ref 0.00–0.07)
Basophils Absolute: 0 10*3/uL (ref 0.0–0.1)
Basophils Relative: 0 %
Eosinophils Absolute: 0.2 10*3/uL (ref 0.0–0.5)
Eosinophils Relative: 2 %
HCT: 43.7 % (ref 36.0–46.0)
Hemoglobin: 13.8 g/dL (ref 12.0–15.0)
Immature Granulocytes: 0 %
Lymphocytes Relative: 24 %
Lymphs Abs: 2.4 10*3/uL (ref 0.7–4.0)
MCH: 29.5 pg (ref 26.0–34.0)
MCHC: 31.6 g/dL (ref 30.0–36.0)
MCV: 93.4 fL (ref 80.0–100.0)
Monocytes Absolute: 0.8 10*3/uL (ref 0.1–1.0)
Monocytes Relative: 8 %
Neutro Abs: 6.6 10*3/uL (ref 1.7–7.7)
Neutrophils Relative %: 66 %
Platelets: 260 10*3/uL (ref 150–400)
RBC: 4.68 MIL/uL (ref 3.87–5.11)
RDW: 13.8 % (ref 11.5–15.5)
WBC: 10 10*3/uL (ref 4.0–10.5)
nRBC: 0 % (ref 0.0–0.2)

## 2021-11-08 LAB — COMPREHENSIVE METABOLIC PANEL
ALT: 20 U/L (ref 0–44)
AST: 15 U/L (ref 15–41)
Albumin: 3.9 g/dL (ref 3.5–5.0)
Alkaline Phosphatase: 57 U/L (ref 38–126)
Anion gap: 7 (ref 5–15)
BUN: 11 mg/dL (ref 6–20)
CO2: 27 mmol/L (ref 22–32)
Calcium: 9 mg/dL (ref 8.9–10.3)
Chloride: 103 mmol/L (ref 98–111)
Creatinine, Ser: 0.75 mg/dL (ref 0.44–1.00)
GFR, Estimated: >60 mL/min (ref >60)
Glucose, Bld: 106 mg/dL — ABNORMAL HIGH (ref 70–99)
Potassium: 4 mmol/L (ref 3.5–5.1)
Sodium: 137 mmol/L (ref 135–145)
Total Bilirubin: 0.5 mg/dL (ref 0.3–1.2)
Total Protein: 7.5 g/dL (ref 6.5–8.1)

## 2021-11-08 LAB — LIPASE, BLOOD: Lipase: 53 U/L — ABNORMAL HIGH (ref 11–51)

## 2021-11-08 MED ORDER — ONDANSETRON HCL 4 MG/2ML IJ SOLN
4.0000 mg | Freq: Once | INTRAMUSCULAR | Status: AC
  Administered 2021-11-08: 4 mg via INTRAVENOUS
  Filled 2021-11-08: qty 2

## 2021-11-08 MED ORDER — ERYTHROMYCIN 5 MG/GM OP OINT
1.0000 | TOPICAL_OINTMENT | Freq: Three times a day (TID) | OPHTHALMIC | 0 refills | Status: DC
Start: 2021-11-08 — End: 2022-09-03

## 2021-11-08 MED ORDER — AMOXICILLIN-POT CLAVULANATE 875-125 MG PO TABS: 1.0000 | ORAL_TABLET | Freq: Two times a day (BID) | ORAL | 0 refills | Status: DC

## 2021-11-08 MED ORDER — MORPHINE SULFATE (PF) 4 MG/ML IV SOLN
4.0000 mg | Freq: Once | INTRAVENOUS | Status: AC
  Administered 2021-11-08: 4 mg via INTRAVENOUS
  Filled 2021-11-08: qty 1

## 2021-11-08 MED ORDER — IOHEXOL 300 MG/ML  SOLN
100.0000 mL | Freq: Once | INTRAMUSCULAR | Status: AC | PRN
  Administered 2021-11-08: 100 mL via INTRAVENOUS
  Filled 2021-11-08: qty 100

## 2021-11-08 MED ORDER — LACTATED RINGERS IV BOLUS
1000.0000 mL | Freq: Once | INTRAVENOUS | Status: AC
  Administered 2021-11-08: 1000 mL via INTRAVENOUS

## 2021-11-08 NOTE — ED Provider Notes (Signed)
? ?[facility] ?Provider Note ? ? ? Event Date/Time  ? First MD Initiated Contact with Patient 11/08/21 0809   ?  (approximate) ? ? ?History  ? ?Chief Complaint ?Abdominal Pain ? ? ?HPI ? ?Tracy Robertson is a 46 y.o. female with past medical history of hypertension, GERD, asthma, and chronic pain syndrome who presents to the ED complaining of abdominal pain.  Patient ports that she has been dealing with waxing and waning pain in the left lower quadrant of her abdomen for the past 24 hours.  She describes the pain as sharp and constant, not exacerbated or alleviated by anything in particular.  She has been feeling nauseous but has not vomited, denies any changes in her bowel movements.  She does state that the pain extends up into her left flank at times, but she denies any fevers, dysuria, or hematuria.  She is status post hysterectomy and denies any vaginal bleeding or discharge.  She also complains of bilateral eye pain and itching with clear drainage, which started in her left eye 2 days ago and has since moved over into the right.  She is concerned that she could have pinkeye. ?  ? ? ?Physical Exam  ? ?Triage Vital Signs: ?ED Triage Vitals  ?Enc Vitals Group  ?   BP 11/08/21 0755 131/89  ?   Pulse Rate 11/08/21 0755 89  ?   Resp 11/08/21 0755 17  ?   Temp 11/08/21 0755 98.2 ?F (36.8 ?C)  ?   Temp Source 11/08/21 0755 Oral  ?   SpO2 11/08/21 0755 94 %  ?   Weight 11/08/21 0753 195 lb 15.8 oz (88.9 kg)  ?   Height 11/08/21 0753 5\' 3"  (1.6 m)  ?   Head Circumference --   ?   Peak Flow --   ?   Pain Score --   ?   Pain Loc --   ?   Pain Edu? --   ?   Excl. in GC? --   ? ? ?Most recent vital signs: ?Vitals:  ? 11/08/21 0755  ?BP: 131/89  ?Pulse: 89  ?Resp: 17  ?Temp: 98.2 ?F (36.8 ?C)  ?SpO2: 94%  ? ? ?Constitutional: Alert and oriented. ?Eyes: Conjunctivae are injected with clear drainage.  Pupils equal, round, and reactive to light bilaterally.  Extraocular movements intact. ?Head:  Atraumatic. ?Nose: No congestion/rhinnorhea. ?Mouth/Throat: Mucous membranes are moist.  ?Cardiovascular: Normal rate, regular rhythm. Grossly normal heart sounds.  2+ radial pulses bilaterally. ?Respiratory: Normal respiratory effort.  No retractions. Lungs CTAB. ?Gastrointestinal: Soft and tender to palpation in the left lower quadrant with voluntary guarding.  Left CVA tenderness noted. No distention. ?Musculoskeletal: No lower extremity tenderness nor edema.  ?Neurologic:  Normal speech and language. No gross focal neurologic deficits are appreciated. ? ? ? ?ED Results / Procedures / Treatments  ? ?Labs ?(all labs ordered are listed, but only abnormal results are displayed) ?Labs Reviewed  ?COMPREHENSIVE METABOLIC PANEL - Abnormal; Notable for the following components:  ?    Result Value  ? Glucose, Bld 106 (*)   ? All other components within normal limits  ?LIPASE, BLOOD - Abnormal; Notable for the following components:  ? Lipase 53 (*)   ? All other components within normal limits  ?URINALYSIS, ROUTINE W REFLEX MICROSCOPIC - Abnormal; Notable for the following components:  ? Color, Urine YELLOW (*)   ? APPearance HAZY (*)   ? All other components within normal limits  ?  CBC WITH DIFFERENTIAL/PLATELET  ? ? ?RADIOLOGY ?CT of abdomen/pelvis reviewed by me with inflammatory changes in the left lower quadrant, no focal fluid collection or dilated bowel loops noted. ? ?PROCEDURES: ? ?Critical Care performed: No ? ?Procedures ? ? ?MEDICATIONS ORDERED IN ED: ?Medications  ?lactated ringers bolus 1,000 mL (1,000 mLs Intravenous New Bag/Given 11/08/21 0839)  ?morphine (PF) 4 MG/ML injection 4 mg (4 mg Intravenous Given 11/08/21 0839)  ?ondansetron [facility]) injection 4 mg (4 mg Intravenous Given 11/08/21 0838)  ?iohexol (OMNIPAQUE) 300 MG/ML solution 100 mL (100 mLs Intravenous Contrast Given 11/08/21 0929)  ? ? ? ?IMPRESSION / MDM / ASSESSMENT AND PLAN / ED COURSE  ?I reviewed the triage vital signs and the nursing notes. ?              ?               ? ?46 y.o. female with past medical history of hypertension, GERD, asthma, and chronic pain syndrome who presents to the ED complaining of 24 hours of worsening pain in the left lower quadrant of her abdomen moving up into her left flank.  She additionally complains of bilateral eye redness and discomfort with clear drainage. ? ?Differential diagnosis includes, but is not limited to, diverticulitis, kidney stone, pyelonephritis, cystitis, gastritis, gastroenteritis, colitis, bacterial conjunctivitis, viral conjunctivitis. ? ?Patient nontoxic-appearing and in no acute distress, vital signs are unremarkable.  She does have significant tenderness to palpation in the left lower quadrant of her abdomen and as well as her left costovertebral area.  We will further assess with CT scan for diverticulitis versus kidney stone, UA is also pending.  Labs including CBC, CMP, and lipase are pending.  We will treat symptomatically with IV morphine and Zofran, reassess following labs and imaging.  She does appear to have a likely viral conjunctivitis, does not wear contact lenses and we will cover for bacterial conjunctivitis with erythromycin ointment. ? ?Labs are reassuring with no leukocytosis or anemia, BMP without AKI or electrolyte abnormality.  LFTs within normal limits, lipase mildly elevated but no findings to suggest pancreatitis on CT scan.  CT does show inflammatory changes in the area of diverticulosis concerning for diverticulitis with no apparent complication.  Patient is feeling better on reassessment and is appropriate for outpatient management with short course of Augmentin.  She was counseled to follow-up with her PCP and to return to the ED for new worsening symptoms, patient agrees with plan. ? ?  ? ? ?FINAL CLINICAL IMPRESSION(S) / ED DIAGNOSES  ? ?Final diagnoses:  ?Diverticulitis  ?Left lower quadrant abdominal pain  ?Conjunctivitis of both eyes, unspecified conjunctivitis type   ? ? ? ?Rx / DC Orders  ? ?ED Discharge Orders   ? ?      Ordered  ?  amoxicillin-clavulanate (AUGMENTIN) 875-125 MG tablet  2 times daily       ? 11/08/21 1000  ?  erythromycin ophthalmic ointment  3 times daily       ? 11/08/21 1000  ? ?  ?  ? ?  ? ? ? ?Note:  This document was prepared using Dragon voice recognition software and may include unintentional dictation errors. ?  ?Chesley Noon, MD ?11/08/21 1002 ? ?

## 2021-11-08 NOTE — ED Notes (Signed)
See triage note  presents with pain to LLQ   describes as sharp but changes in intensity    subjective fever yesterday  also having some pain to both eyes ,ear pian and neck pain  states sx's started couple of days ago ?

## 2021-11-08 NOTE — ED Triage Notes (Signed)
Pt c/o LLQ pain with nausea for the past 2 days, and also c/o BL eye pain with redness and drainage/. ? ?

## 2021-11-09 ENCOUNTER — Emergency Department: Payer: 59

## 2021-11-09 ENCOUNTER — Observation Stay: Payer: 59

## 2021-11-09 ENCOUNTER — Observation Stay
Admission: EM | Admit: 2021-11-09 | Discharge: 2021-11-09 | Disposition: A | Discharge status: Home / Self Care | Payer: 59 | Attending: Osteopathic Medicine | Admitting: Osteopathic Medicine

## 2021-11-09 DIAGNOSIS — J45909 Unspecified asthma, uncomplicated: Secondary | ICD-10-CM | POA: Diagnosis not present

## 2021-11-09 DIAGNOSIS — I10 Essential (primary) hypertension: Secondary | ICD-10-CM | POA: Diagnosis not present

## 2021-11-09 DIAGNOSIS — Z79899 Other long term (current) drug therapy: Secondary | ICD-10-CM | POA: Insufficient documentation

## 2021-11-09 DIAGNOSIS — Z87898 Personal history of other specified conditions: Secondary | ICD-10-CM

## 2021-11-09 DIAGNOSIS — R300 Dysuria: Secondary | ICD-10-CM

## 2021-11-09 DIAGNOSIS — Z7951 Long term (current) use of inhaled steroids: Secondary | ICD-10-CM | POA: Diagnosis not present

## 2021-11-09 DIAGNOSIS — Z20822 Contact with and (suspected) exposure to covid-19: Secondary | ICD-10-CM | POA: Diagnosis not present

## 2021-11-09 DIAGNOSIS — I639 Cerebral infarction, unspecified: Secondary | ICD-10-CM

## 2021-11-09 DIAGNOSIS — R531 Weakness: Secondary | ICD-10-CM

## 2021-11-09 DIAGNOSIS — R29818 Other symptoms and signs involving the nervous system: Secondary | ICD-10-CM | POA: Diagnosis not present

## 2021-11-09 DIAGNOSIS — G894 Chronic pain syndrome: Secondary | ICD-10-CM | POA: Diagnosis present

## 2021-11-09 LAB — DIFFERENTIAL
Abs Immature Granulocytes: 0.04 10*3/uL (ref 0.00–0.07)
Basophils Absolute: 0.1 10*3/uL (ref 0.0–0.1)
Basophils Relative: 1 %
Eosinophils Absolute: 0.3 10*3/uL (ref 0.0–0.5)
Eosinophils Relative: 2 %
Immature Granulocytes: 0 %
Lymphocytes Relative: 29 %
Lymphs Abs: 3 10*3/uL (ref 0.7–4.0)
Monocytes Absolute: 0.9 10*3/uL (ref 0.1–1.0)
Monocytes Relative: 9 %
Neutro Abs: 6.3 10*3/uL (ref 1.7–7.7)
Neutrophils Relative %: 59 %

## 2021-11-09 LAB — URINALYSIS, ROUTINE W REFLEX MICROSCOPIC
Bilirubin Urine: NEGATIVE
Glucose, UA: NEGATIVE mg/dL
Hgb urine dipstick: NEGATIVE
Ketones, ur: NEGATIVE mg/dL
Leukocytes,Ua: NEGATIVE
Nitrite: NEGATIVE
Protein, ur: NEGATIVE mg/dL
Specific Gravity, Urine: 1.014 (ref 1.005–1.030)
pH: 6 (ref 5.0–8.0)

## 2021-11-09 LAB — CBC
HCT: 43.1 % (ref 36.0–46.0)
Hemoglobin: 13.7 g/dL (ref 12.0–15.0)
MCH: 29.4 pg (ref 26.0–34.0)
MCHC: 31.8 g/dL (ref 30.0–36.0)
MCV: 92.5 fL (ref 80.0–100.0)
Platelets: 251 10*3/uL (ref 150–400)
RBC: 4.66 MIL/uL (ref 3.87–5.11)
RDW: 13.7 % (ref 11.5–15.5)
WBC: 10.5 10*3/uL (ref 4.0–10.5)
nRBC: 0 % (ref 0.0–0.2)

## 2021-11-09 LAB — LIPID PANEL
Cholesterol: 193 mg/dL (ref 0–200)
HDL: 67 mg/dL (ref >40)
LDL Cholesterol: 107 mg/dL — ABNORMAL HIGH (ref 0–99)
Total CHOL/HDL Ratio: 2.9 RATIO
Triglycerides: 93 mg/dL (ref <150)
VLDL: 19 mg/dL (ref 0–40)

## 2021-11-09 LAB — COMPREHENSIVE METABOLIC PANEL
ALT: 20 U/L (ref 0–44)
AST: 15 U/L (ref 15–41)
Albumin: 3.7 g/dL (ref 3.5–5.0)
Alkaline Phosphatase: 57 U/L (ref 38–126)
Anion gap: 7 (ref 5–15)
BUN: 14 mg/dL (ref 6–20)
CO2: 29 mmol/L (ref 22–32)
Calcium: 9.7 mg/dL (ref 8.9–10.3)
Chloride: 102 mmol/L (ref 98–111)
Creatinine, Ser: 0.83 mg/dL (ref 0.44–1.00)
GFR, Estimated: >60 mL/min (ref >60)
Glucose, Bld: 99 mg/dL (ref 70–99)
Potassium: 3.8 mmol/L (ref 3.5–5.1)
Sodium: 138 mmol/L (ref 135–145)
Total Bilirubin: 0.5 mg/dL (ref 0.3–1.2)
Total Protein: 7.2 g/dL (ref 6.5–8.1)

## 2021-11-09 LAB — URINE DRUG SCREEN, QUALITATIVE (ARMC ONLY)
Amphetamines, Ur Screen: NOT DETECTED
Barbiturates, Ur Screen: NOT DETECTED
Benzodiazepine, Ur Scrn: NOT DETECTED
Cannabinoid 50 Ng, Ur ~~LOC~~: NOT DETECTED
Cocaine Metabolite,Ur ~~LOC~~: NOT DETECTED
MDMA (Ecstasy)Ur Screen: NOT DETECTED
Methadone Scn, Ur: NOT DETECTED
Opiate, Ur Screen: POSITIVE — AB
Phencyclidine (PCP) Ur S: NOT DETECTED
Tricyclic, Ur Screen: NOT DETECTED

## 2021-11-09 LAB — APTT: aPTT: 30 seconds (ref 24–36)

## 2021-11-09 LAB — HEMOGLOBIN A1C
Hgb A1c MFr Bld: 5.8 % — ABNORMAL HIGH (ref 4.8–5.6)
Mean Plasma Glucose: 119.76 mg/dL

## 2021-11-09 LAB — PROTIME-INR
INR: 0.9 (ref 0.8–1.2)
Prothrombin Time: 12 seconds (ref 11.4–15.2)

## 2021-11-09 LAB — RESP PANEL BY RT-PCR (FLU A&B, COVID) ARPGX2
Influenza A by PCR: NEGATIVE
Influenza B by PCR: NEGATIVE
SARS Coronavirus 2 by RT PCR: NEGATIVE

## 2021-11-09 LAB — ETHANOL: Alcohol, Ethyl (B): <10 mg/dL (ref <10)

## 2021-11-09 LAB — TROPONIN I (HIGH SENSITIVITY)
Troponin I (High Sensitivity): 3 ng/L (ref <18)
Troponin I (High Sensitivity): 2 ng/L (ref <18)

## 2021-11-09 LAB — HIV ANTIBODY (ROUTINE TESTING W REFLEX): HIV Screen 4th Generation wRfx: NONREACTIVE

## 2021-11-09 LAB — POC URINE PREG, ED: Preg Test, Ur: NEGATIVE

## 2021-11-09 MED ORDER — ENOXAPARIN SODIUM 60 MG/0.6ML IJ SOSY: 0.5000 mg/kg | PREFILLED_SYRINGE | INTRAMUSCULAR | Status: DC

## 2021-11-09 MED ORDER — IOHEXOL 350 MG/ML SOLN
115.0000 mL | Freq: Once | INTRAVENOUS | Status: AC | PRN
  Administered 2021-11-09: 115 mL via INTRAVENOUS

## 2021-11-09 MED ORDER — SPIRONOLACTONE 25 MG PO TABS
25.0000 mg | ORAL_TABLET | Freq: Every day | ORAL | Status: DC
  Administered 2021-11-09: 25 mg via ORAL
  Filled 2021-11-09: qty 1

## 2021-11-09 MED ORDER — ACETAMINOPHEN 160 MG/5ML PO SOLN
650.0000 mg | ORAL | Status: DC | PRN
  Filled 2021-11-09: qty 20.3

## 2021-11-09 MED ORDER — CLOPIDOGREL BISULFATE 75 MG PO TABS
300.0000 mg | ORAL_TABLET | Freq: Once | ORAL | Status: AC
  Administered 2021-11-09: 300 mg via ORAL
  Filled 2021-11-09: qty 4

## 2021-11-09 MED ORDER — ASPIRIN 81 MG PO CHEW
324.0000 mg | CHEWABLE_TABLET | Freq: Once | ORAL | Status: AC
  Administered 2021-11-09: 324 mg via ORAL
  Filled 2021-11-09: qty 4

## 2021-11-09 MED ORDER — ACETAMINOPHEN 650 MG RE SUPP: 650.0000 mg | RECTAL | Status: DC | PRN

## 2021-11-09 MED ORDER — LOSARTAN POTASSIUM 25 MG PO TABS
25.0000 mg | ORAL_TABLET | Freq: Every day | ORAL | Status: DC
Start: 2021-11-09 — End: 2021-11-09
  Administered 2021-11-09: 25 mg via ORAL
  Filled 2021-11-09: qty 1

## 2021-11-09 MED ORDER — CIPROFLOXACIN HCL 500 MG PO TABS: 500.0000 mg | ORAL_TABLET | Freq: Two times a day (BID) | ORAL | 0 refills | Status: AC

## 2021-11-09 MED ORDER — SODIUM CHLORIDE 0.9% FLUSH
3.0000 mL | Freq: Once | INTRAVENOUS | Status: AC
  Administered 2021-11-09: 3 mL via INTRAVENOUS

## 2021-11-09 MED ORDER — STROKE: EARLY STAGES OF RECOVERY BOOK: Freq: Once | Status: AC

## 2021-11-09 MED ORDER — ACETAMINOPHEN 325 MG PO TABS: 650.0000 mg | ORAL_TABLET | ORAL | Status: DC | PRN

## 2021-11-09 NOTE — ED Notes (Signed)
Patient transported to MRI 

## 2021-11-09 NOTE — Progress Notes (Signed)
OT Cancellation Note ? ?Patient Details ?Name: Tracy Robertson ?MRN: [id number] ?DOB: Jul 29, 1975 ? ? ?Cancelled Treatment:    Reason Eval/Treat Not Completed: OT screened, no needs identified, will sign off (pt reports no deficit in LUE, performing all ADLs independently. OT will screen and sign off as no OT needs identified at this time. Please reconsult if there is a change in functional status.) ? ?Oleta Mouse, OTD OTR/L  ?11/09/21, 9:06 AM  ?

## 2021-11-09 NOTE — H&P (Signed)
?History and Physical  ? ? ?Patient: Tracy Robertson VHQ:[id number] DOB: 1975/08/04 ?DOA: 11/09/2021 ?DOS: the patient was seen and examined on 11/09/2021 ?PCP: [facility]  ?Patient coming from: Home ? ?Chief Complaint:  ?Chief Complaint  ?Patient presents with  ? Code Stroke  ? ? ?HPI: Tracy Robertson is a 46 y.o. female with medical history significant for HTN, GERD, asthma, chronic pain, seen in the ED on 4/28 for abdominal pain, diagnosed with diverticulitis and conjunctivitis and discharged with Augmentin and erythromycin ophthalmic ointment who returns to the ED several hours later as a code stroke, after going to bed at 2100, waking up an hour prior to arrival with left-sided weakness.  EMS reported an NIH of 2.  CT head showed no evidence of acute hemorrhage or infarct.  Patient was evaluated by neurology and found to be 30 minutes outside tPA window.  Recommendation for CTA head and neck and if negative for LVO then further work-up with MRI among other recommendations. ?CTA was negative for LVO.Marland Kitchen  Patient's symptoms completely resolved by admission ? ?Otherwise, ED course and data review: CBC and CMP, INR and troponin were within normal limits and pregnancy test negative EKG, personally viewed and interpreted showed sinus rhythm at 95. ? ?Patient received Plavix 300 mg and aspirin 325.  Hospitalist consulted for admission. ?  ? ?Review of Systems: As mentioned in the history of present illness. All other systems reviewed and are negative. ?Past Medical History:  ?Diagnosis Date  ? Hypertension   ? ?Past Surgical History:  ?Procedure Laterality Date  ? ABDOMINAL HYSTERECTOMY    ? TONSILLECTOMY    ? ?Social History:  reports that she has never smoked. She has never used smokeless tobacco. She reports that she does not drink alcohol and does not use drugs. ? ?No Known Allergies ? ?History reviewed. No pertinent family history. ? ?Prior to Admission medications   ?Medication Sig Start Date End  Date Taking? Authorizing Provider  ?acyclovir (ZOVIRAX) 400 MG tablet Take 2 tablets (800 mg total) by mouth 5 (five) times daily. 10/26/21   Robertson, Tracy Ivory, PA-C  ?albuterol (VENTOLIN HFA) 108 (90 Base) MCG/ACT inhaler Inhale into the lungs. 03/25/19   [provider]  ?amoxicillin-clavulanate (AUGMENTIN) 875-125 MG tablet Take 1 tablet by mouth 2 (two) times daily for 5 days. 11/08/21 11/13/21  Chesley Noon, MD  ?DULoxetine (CYMBALTA) 60 MG capsule Take by mouth. 10/25/19 10/24/20  [provider]  ?erythromycin ophthalmic ointment Place 1 application. into both eyes 3 (three) times daily. 11/08/21   Chesley Noon, MD  ?fluticasone Aleda Grana) 50 MCG/ACT nasal spray Place into the nose. 07/08/19   [provider]  ?losartan (COZAAR) 25 MG tablet Take 25 mg by mouth daily.    [provider]  ?meloxicam (MOBIC) 15 MG tablet TAKE 1 TABLET(15 MG) BY MOUTH DAILY 07/22/20   Felecia Shelling, DPM  ?metroNIDAZOLE (FLAGYL) 500 MG tablet Take 500 mg by mouth 2 (two) times daily. 05/18/20   [provider]  ?pantoprazole (PROTONIX) 40 MG tablet Take 40 mg by mouth 2 (two) times daily.    [provider]  ?spironolactone (ALDACTONE) 25 MG tablet Take 25 mg by mouth daily. 03/21/20   [provider]  ?topiramate (TOPAMAX) 25 MG tablet  12/30/18   [provider]  ?tretinoin (RETIN-A) 0.05 % cream APPLY NIGHTLY TO SCAP AS DIRECTED 02/13/20   [provider]  ?triamcinolone ointment (KENALOG) 0.1 % Apply 1 application. topically 2 (  two) times daily. 10/26/21   Robertson, Tracy Ivory, PA-C  ? ? ?Physical Exam: ?Vitals:  ? 11/09/21 0132 11/09/21 0230 11/09/21 3220 11/09/21 0315  ?BP:   (!) 128/96   ?Pulse:  91 79 80  ?Resp:  19 19 19   ?Temp: 98 ?F (36.7 ?C)     ?TempSrc: Oral     ?SpO2:  100% 100% 100%  ?Weight:      ?Height:      ? ?Physical Exam ?Vitals and nursing note reviewed.  ?Constitutional:   ?   General: She is not in acute distress. ?HENT:  ?    Head: Normocephalic and atraumatic.  ?Cardiovascular:  ?   Rate and Rhythm: Normal rate and regular rhythm.  ?   Heart sounds: Normal heart sounds.  ?Pulmonary:  ?   Effort: Pulmonary effort is normal.  ?   Breath sounds: Normal breath sounds.  ?Abdominal:  ?   Palpations: Abdomen is soft.  ?   Tenderness: There is no abdominal tenderness.  ?Neurological:  ?   General: No focal deficit present.  ?   Mental Status: Mental status is at baseline.  ? ? ? ?Data Reviewed: ?Relevant notes from primary care and specialist visits, past discharge summaries as available in EHR, including Care Everywhere. ?Prior diagnostic testing as pertinent to current admission diagnoses ?Updated medications and problem lists for reconciliation ?ED course, including vitals, labs, imaging, treatment and response to treatment ?Triage notes, nursing and pharmacy notes and ED provider's notes ?Notable results as noted in HPI ? ? ?Assessment and Plan: ?* Acute focal neurologic deficit with complete resolution ?Recommendations per neurology: ?  ?      Stroke/Telemetry Floor ?      Neuro Checks ?      Bedside Swallow Eval ?      DVT Prophylaxis ?      IV Fluids, Normal Saline ?      Head of Bed 30 Degrees ?      Euglycemia and Avoid Hyperthermia (PRN Acetaminophen) ?      Bolus with Clopidogrel 300 mg bolus x1 and initiate dual antiplatelet therapy with Aspirin 81 mg daily and Clopidogrel 75 mg daily ?      Antihypertensives PRN if Blood pressure is greater than 220/120 or there is a concern for End organ damage/contraindications for permissive HTN. If blood pressure is greater than 220/120 give labetalol PO or IV or Vasotec IV with a goal of 15% reduction in BP during the first 24 hours. ?Continuous cardiac monitoring, follow-up MRI.  Echo ? ?Neurology to follow ? ?Chronic pain syndrome ?Continue Topamax, Mobic and Cymbalta pending verification ? ?Benign essential hypertension ?Permissive hypertension as above ? ? ? ? ? ? ?Advance Care Planning:    Code Status: Not on file full ? ?Consults: Neurologist, Dr. ? ?Family Communication: none ? ?Severity of Illness: ?The appropriate patient status for this patient is OBSERVATION. Observation status is judged to be reasonable and necessary in order to provide the required intensity of service to ensure the patient's safety. The patient's presenting symptoms, physical exam findings, and initial radiographic and laboratory data in the context of their medical condition is felt to place them at decreased risk for further clinical deterioration. Furthermore, it is anticipated that the patient will be medically stable for discharge from the hospital within 2 midnights of admission.  ? ?Author: ?Selina Cooley, MD ?11/09/2021 3:46 AM ? ?For on call review [email or web address].  ?

## 2021-11-09 NOTE — ED Notes (Signed)
Patient transported to CT 

## 2021-11-09 NOTE — Assessment & Plan Note (Addendum)
Patient felt to be minimal risk for true TIA, MRI negative for CVA, as noted above, patient requests discharge prior to completing work-up with echocardiogram, prior to neurology follow-up today.  In shared decision-making and patient with capacity, patient felt safe for discharge with close outpatient follow-up.  Patient advised to follow-up with her PCP/seek emergency medical attention with any new/worsening symptoms. ?

## 2021-11-09 NOTE — Care Management (Signed)
Pre-rounds chart review and preliminary plan / goals for today: ?Significant overnight events per chart: none ?Significant new findings on labs/other diagnostics:  ?MRI brain WNL ?Lipid panel resulted, LDL above goal if this was a true CVA ?Dispo plan: home when completed workup, pending new events ?Pending/Plan:  ?Await Echocardiogram results ?Appreciate neurology recs re:  ?clear to d/c home (pending Echo, other recs) ?Rx plan: continue DAPT, add statin?  ? ?Please feel free to reach out via secure chat in Epic for non-urgent issues. Please page for urgent matters! ? ?This note will be updated after rounds to full progress note / discharge note as appropriate  ? ? ?

## 2021-11-09 NOTE — Discharge Summary (Signed)
?Physician Discharge Summary ?  ?Patient: Tracy Robertson MRN: [id number] DOB: [birth year]  ?Admit date:     11/09/2021  ?Discharge date: 11/09/21  ?Discharge Physician: Sunnie Nielsen  ? ?PCP: [facility]  ? ?Recommendations at discharge:  ?Follow urine culture results, patient has been placed on ciprofloxacin, reports 1 urinary tract infection earlier this year however was unable to locate any urine culture results from this treatment. ?Patient asked to be discharged prior to completion of echocardiogram.  Given normal MRI and complete resolution of symptoms, minimal risk factors, true CVA/TIA was felt to be unlikely so will not discharge on DAPT/statin ? ? ?Discharge instructions for patient printed on the AVS: ? ?"We have decided to forgo echocardiogram for full stroke workup as risks are minimal however you have accepted that there is SOME risk of worsening / missing important information. If you experience concerning symptoms please seek emergency medical care ASAP. ? ?Please arrange follow-up with your primary care office sometime within the next week.  Call their office and ask specifically for a hospital follow-up appointment.  Your discharge summary has included instructions for your primary care team to arrange follow-up with neurology if necessary. ? ?We have changed your antibiotics to cover for possible UTI.  Please stop the amoxicillin/clavulanic acid, continue metronidazole, we had added ciprofloxacin.  Ciprofloxacin plus metronidazole should treat the diverticulitis, ciprofloxacin should also cover typical urinary tract infection pathogens.  Will obtain urine culture but results will not be back for another few days.  Your discharge summary includes instructions to your primary care team to follow-up on these results, you can also view these on MyChart." ? ? ? ? ? ?Discharge Diagnoses: ?Principal Problem: ?  Acute focal neurologic deficit with complete resolution ?Active Problems: ?   Benign essential hypertension ?  Chronic pain syndrome ? ?Resolved Problems: ?  * No resolved hospital problems. * ? ?Hospital Course: ?Tracy Robertson is a 46 y.o. female with medical history significant for HTN, GERD, asthma, chronic pain, seen in the ED on 4/28 for abdominal pain, diagnosed with diverticulitis and conjunctivitis and discharged with Augmentin and erythromycin ophthalmic ointment who returns to the ED several hours later as a code stroke, after going to bed at 2100, waking up an hour prior to arrival with left-sided weakness.  EMS reported an NIH of 2.  CT head showed no evidence of acute hemorrhage or infarct.  Patient was evaluated by neurology and found to be 30 minutes outside tPA window.  Recommendation for CTA head and neck and if negative for LVO then further work-up with MRI among other recommendations. CTA was negative for LVO.Marland Kitchen  Patient's symptoms completely resolved by admission.  MRI brain was within normal limits.  On examination/interview with the patient this morning, she attributes her symptoms to "feeling crappy" from diverticular issues as well as concern for urinary tract infection with dysuria.  Patient states she has a wedding to go to this afternoon so is requesting discharge, she and I discussed risk versus benefit of foregoing final consultation with neurology and echocardiogram, however I am reassured given resolution of symptoms, minimal risk factors, normal brain MRI that she is low risk for true CVA/TIA.  Patient accepts risk of leaving with out to complete work-up and consultation.  In shared decision-making, I believe patient has capacity to understand her choice and she will be discharged. ? ?Assessment and Plan: ?* Acute focal neurologic deficit with complete resolution ?Patient felt to be minimal risk for true TIA,  MRI negative for CVA, as noted above, patient requests discharge prior to completing work-up with echocardiogram, prior to neurology follow-up today.   In shared decision-making and patient with capacity, patient felt safe for discharge with close outpatient follow-up.  Patient advised to follow-up with her PCP/seek emergency medical attention with any new/worsening symptoms. ? ?Dysuria ?Some bacteria in urine, no significant WBC, patient reports dysuria, will add on urine culture but given that she has been on antibiotics recently this may be false negative, patient was given Augmentin and Flagyl for diverticulitis, will change to ciprofloxacin/Flagyl which should have better coverage for possible UTI.  Patient to follow-up with her PCP/return to ED or seek other emergency medical attention with any new/worsening symptoms ? ?Chronic pain syndrome ?Continue Topamax, Mobic and Cymbalta pending verification ? ?Benign essential hypertension ?Permissive hypertension as above ? ? ? ? ?  ? ? ?Consultants: neurology ?Procedures performed: none  ?Disposition: Home ?Diet recommendation:  ?Discharge Diet Orders (From admission, onward)  ? ?  Start     Ordered  ? 11/09/21 0000  Diet - low sodium heart healthy       ? 11/09/21 1005  ? ?  ?  ? ?  ? ?Cardiac diet ?DISCHARGE MEDICATION: ?Allergies as of 11/09/2021   ?No Known Allergies ?  ? ?  ?Medication List  ?  ? ?STOP taking these medications   ? ?acyclovir 400 MG tablet ?Commonly known as: Zovirax ?  ?amoxicillin-clavulanate 875-125 MG tablet ?Commonly known as: Augmentin ?  ? ?  ? ?TAKE these medications   ? ?ciprofloxacin 500 MG tablet ?Commonly known as: Cipro ?Take 1 tablet (500 mg total) by mouth 2 (two) times daily for 7 days. ?  ?DULoxetine 60 MG capsule ?Commonly known as: CYMBALTA ?Take by mouth. ?  ?erythromycin ophthalmic ointment ?Place 1 application. into both eyes 3 (three) times daily. ?  ?fluticasone 50 MCG/ACT nasal spray ?Commonly known as: FLONASE ?Place into the nose. ?  ?losartan 25 MG tablet ?Commonly known as: COZAAR ?Take 25 mg by mouth daily. ?  ?meloxicam 15 MG tablet ?Commonly known as: MOBIC ?TAKE  1 TABLET(15 MG) BY MOUTH DAILY ?  ?metroNIDAZOLE 500 MG tablet ?Commonly known as: FLAGYL ?Take 500 mg by mouth 2 (two) times daily. ?  ?pantoprazole 40 MG tablet ?Commonly known as: PROTONIX ?Take 40 mg by mouth 2 (two) times daily. ?  ?spironolactone 25 MG tablet ?Commonly known as: ALDACTONE ?Take 25 mg by mouth daily. ?  ?topiramate 25 MG tablet ?Commonly known as: TOPAMAX ?  ?tretinoin 0.05 % cream ?Commonly known as: RETIN-A ?APPLY NIGHTLY TO SCAP AS DIRECTED ?  ?triamcinolone ointment 0.1 % ?Commonly known as: KENALOG ?Apply 1 application. topically 2 (two) times daily. ?  ?Ventolin HFA 108 (90 Base) MCG/ACT inhaler ?Generic drug: albuterol ?Inhale into the lungs. ?  ? ?  ? ? ?Discharge Exam: ?Filed Weights  ? 11/09/21 0105  ?Weight: 90.3 kg  ?Constitutional:  ?VSS, see nurse notes ?General Appearance: alert, well-developed, well-nourished, NAD ?Eyes: ?Normal lids and conjunctive, non-icteric sclera ?PERRLA ?Ears, Nose, Mouth, Throat: ?Normal appearance ?Neck: ?No masses, trachea midline ?Respiratory: ?Normal respiratory effort ?Breath sounds normal, no wheeze/rhonchi/rales ?Cardiovascular: ?S1/S2 normal, no murmur/rub/gallop auscultated ?No lower extremity edema ?Gastrointestinal: ?Nontender, no masses ?Musculoskeletal:  ?Gait normal ?No clubbing/cyanosis of digits ?Neurological: ?No cranial nerve deficit on limited exam ?Motor and sensation intact and symmetric ?Normal coordination of limbs, EOMI, normal speech ?Psychiatric: ?Normal judgment/insight ?Normal mood and affect ? ? ?Condition at discharge: good ? ?  The results of significant diagnostics from this hospitalization (including imaging, microbiology, ancillary and laboratory) are listed below for reference.  ? ?Imaging Studies: ?MR BRAIN WO CONTRAST ? ?Result Date: 11/09/2021 ?CLINICAL DATA:  46 year old female code stroke presentation. Left side weakness. Unrevealing plain head CT, CTA and CT Perfusion earlier today. EXAM: MRI HEAD WITHOUT CONTRAST  TECHNIQUE: Multiplanar, multiecho pulse sequences of the brain and surrounding structures were obtained without intravenous contrast. COMPARISON:  CTA/CTP 0200 hours today and earlier. FINDINGS: Ferne Coe

## 2021-11-09 NOTE — Consult Note (Addendum)
?[facility] Consult Services ? ? ?Patient Name:   Tracy Robertson, Tracy Robertson ?Date of Birth:   [birth year] ?Identification Number:   MRN - [id number] ?Date of Service:   11/09/2021 00:57:05 ? ?Diagnosis: ?      I63.9 - Cerebrovascular accident (CVA), unspecified mechanism ([place]) ? ?Impression: ?     46yo woman with history of smoking(vapes), HTN, lumbar spondylosis presents with left sided weakness. She is unfortunately just outside the window by about 30 minutes by the time of my exam for thrombolytics with LKW when she went to bed thus not a candidate. Symptoms suggestive of right hemispheric stroke and given the severity at onset will obtain CTA/P. If negative for LVO recommend further work up with MRI brain w/o, check 2D echo, start aspirin and plavix now, permissive HTN to < 220/120. ? ?Our recommendations are outlined below. ? ?Recommendations: ? ?      Stroke/Telemetry Floor ?      Neuro Checks ?      Bedside Swallow Eval ?      DVT Prophylaxis ?      IV Fluids, Normal Saline ?      Head of Bed 30 Degrees ?      Euglycemia and Avoid Hyperthermia (PRN Acetaminophen) ?      Bolus with Clopidogrel 300 mg bolus x1 and initiate dual antiplatelet therapy with Aspirin 81 mg daily and Clopidogrel 75 mg daily ?      Antihypertensives PRN if Blood pressure is greater than 220/120 or there is a concern for End organ damage/contraindications for permissive HTN. If blood pressure is greater than 220/120 give labetalol PO or IV or Vasotec IV with a goal of 15% reduction in BP during the first 24 hours. ? ?Per facility request will defer further work up, management, and referrals to inpatient service, inclusive of inpatient neurology consult. ? ?Sign Out: ?      Discussed with Emergency Department Provider ? ? ? ?------------------------------------------------------------------------------ ? ?Advanced Imaging: ?Advanced imaging has been ordered. Results pending. ? ? ?Metrics: ?Last Known Well: 11/08/2021  20:00:00 ?[facility] Notification Time: 11/09/2021 00:57:05 ?Arrival Time: 11/09/2021 00:48:00 ?Stamp Time: 11/09/2021 00:57:05 ?Initial Response Time: 11/09/2021 01:00:35 ?Symptoms: left side weakness. ?NIHSS Start Assessment Time: 11/09/2021 01:02:21 ?Patient is not a candidate for Thrombolytic. ?Thrombolytic Medical Decision: 11/09/2021 01:03:38 ?Patient was not deemed candidate for Thrombolytic because of following reasons: ?Last Well Known Above 4.5 Hours. ? ?CT head showed no acute hemorrhage or acute core infarct. ? ?Primary Provider Notified of Diagnostic Impression and Management Plan on: 11/09/2021 01:27:25 ? ? ? ?------------------------------------------------------------------------------ ? ?History of Present Illness: ?Patient is a 46 year old Female. ? ?Patient was brought by EMS for symptoms of left side weakness. ?46yo woman with history of smoking(vapes), HTN, lumbar spondylosis presents with left sided weakness. She actually had been seen in the ED this morning for abdominal pain, was diagnosed with diverticulitis. She also had some bilateral eye irritation and was diagnosed with conjuctivitis, sent home with antibiotics. She went to bed early between 8 and 830pm this evening and woke up a short while ago unable to move her left side. She asked her husband to help her to the bathroom but when she couldn't get out of bed they called EMS. She is not on any antiplatelet agents, no prior history of stroke. ? ? ?Past Medical History: ?     Hypertension ? ?Medications: ? ?No Anticoagulant use  ?No Antiplatelet use ?Reviewed EMR for current medications ? ?Allergies:  ?Reviewed ? ?Social History: ?  Smoking: Yes ?Alcohol Use: No ?Drug Use: No ? ?Family History: ? ?There is no family history of premature cerebrovascular disease pertinent to this consultation ? ?ROS : ?14 Points Review of Systems was performed and was negative except mentioned in HPI. ? ?Past Surgical History: ?There Is No Surgical  History Contributory To Today?s Visit ? ? ? ?Examination: ?BP(133/94), Pulse(84), ?1A: Level of Consciousness - Alert; keenly responsive + 0 ?1B: Ask Month and Age - Both Questions Right + 0 ?1C: Blink Eyes & Squeeze Hands - Performs Both Tasks + 0 ?2: Test Horizontal Extraocular Movements - Normal + 0 ?3: Test Visual Fields - No Visual Loss + 0 ?4: Test Facial Palsy (Use Grimace if Obtunded) - Normal symmetry + 0 ?5A: Test Left Arm Motor Drift - Drift, but doesn't hit bed + 1 ?5B: Test Right Arm Motor Drift - No Drift for 10 Seconds + 0 ?6A: Test Left Leg Motor Drift - Drift, but doesn't hit bed + 1 ?6B: Test Right Leg Motor Drift - No Drift for 5 Seconds + 0 ?7: Test Limb Ataxia (FNF/Heel-Shin) - No Ataxia + 0 ?8: Test Sensation - Mild-Moderate Loss: Less Sharp/More Dull + 1 ?9: Test Language/Aphasia - Normal; No aphasia + 0 ?10: Test Dysarthria - Normal + 0 ?11: Test Extinction/Inattention - No abnormality + 0 ? ?NIHSS Score: 3 ? ?NIHSS Free Text : sensation reduced to LT in the left leg, left leg>arm weakness ? ?Pre-Morbid Modified Rankin Scale: ?0 Points = No symptoms at all ? ? ?Patient/Family was informed the Neurology Consult would occur via TeleHealth consult by way of interactive audio and video telecommunications and consented to receiving care in this manner. ? ? ?Patient is being evaluated for possible acute neurologic impairment and high probability of imminent or life-threatening deterioration. I spent total of 40 minutes providing care to this patient, including time for face to face visit via telemedicine, review of medical records, imaging studies and discussion of findings with providers, the patient and/or family. ? ? ?Dr Annamary Carolin ? ? ?[facility] ?[phone] ? ? ?Case [id number] ? ?Advanced Imaging: ? ?CTA Head and Neck Completed. ? ?CTP Completed. ? ?LVO:No ?

## 2021-11-09 NOTE — Assessment & Plan Note (Signed)
Some bacteria in urine, no significant WBC, patient reports dysuria, will add on urine culture but given that she has been on antibiotics recently this may be false negative, patient was given Augmentin and Flagyl for diverticulitis, will change to ciprofloxacin/Flagyl which should have better coverage for possible UTI.  Patient to follow-up with her PCP/return to ED or seek other emergency medical attention with any new/worsening symptoms ?

## 2021-11-09 NOTE — Assessment & Plan Note (Signed)
Permissive hypertension as above ?

## 2021-11-09 NOTE — ED Triage Notes (Signed)
Pt arrives code stroke, per ems pt went to bed around 2030-2100 and woke up one hour pta with left sided weakness. Pt with clear speech per ems, symmetrical face per ems, NIH per ems of 2. Pt immediately to ct on ems strecher with md approval ?

## 2021-11-09 NOTE — Progress Notes (Signed)
Dr. Senaida Ores made aware of CTA/P head and neck results ?

## 2021-11-09 NOTE — Progress Notes (Signed)
SLP Cancellation Note ? ?Patient Details ?Name: Tracy Robertson ?MRN: [id number] ?DOB: 01/07/1976 ? ? ?Cancelled treatment:       Reason Eval/Treat Not Completed: SLP screened, no needs identified, will sign off (chart reviewed; consulted NSG then met w/ pt/Husband in room. Pt resting.) ?Pt/Husband denied any difficulty swallowing and is currently on a regular diet; tolerates swallowing pills w/ water and drinking water last night per NSG/Husband. Pt has been conversing in conversation w/out expressive/receptive deficits noted by staff and Husband; Speech clear. ?No further skilled ST services indicated as pt appears at her baseline. Pt agreed. NSG to reconsult if any change in status while admitted.   ? ? ? ? ? ?Orinda Kenner, MS, CCC-SLP ?Speech Language Pathologist ?[facility]; [place] ?[phone] (ascom) ?Authur Cubit ?11/09/2021, 9:19 AM ?

## 2021-11-09 NOTE — Progress Notes (Signed)
Anticoagulation monitoring(Lovenox): ? ?46 yo female ordered Lovenox 40 mg Q24h ?   ?Filed Weights  ? 11/09/21 0105  ?Weight: 90.3 kg (199 lb)  ? ?BMI 35.3   ? ?Lab Results  ?Component Value Date  ? CREATININE 0.83 11/09/2021  ? CREATININE 0.75 11/08/2021  ? ?Estimated Creatinine Clearance: 92.3 mL/min (by C-G formula based on SCr of 0.83 mg/dL). ?Hemoglobin & Hematocrit  ?   ?Component Value Date/Time  ? HGB 13.7 11/09/2021 0107  ? HCT 43.1 11/09/2021 0107  ? ? ? ?Per Protocol for Patient with estCrcl > 30 ml/min and BMI > 30, will transition to Lovenox 45 mg Q24h.  ?  ? ? ?

## 2021-11-09 NOTE — Progress Notes (Signed)
PT Cancellation Note ? ?Patient Details ?Name: Tracy Robertson ?MRN: [id number] ?DOB: 21-Feb-1976 ? ? ?Cancelled Treatment:    Reason Eval/Treat Not Completed: PT screened, no needs identified, will sign off PT orders received, chart reviewed. PT spoke with pt who was sitting up in bed, reporting no mobility deficits & declining mobility at this time. Pt reports she's at her PLOF. No acute PT needs identified at this time. PT to sign off. Please re-consult if new needs arise. ? ?Aleda Grana, PT, DPT ?11/09/21, 9:06 AM ? ? ?Sandi Mariscal ?11/09/2021, 9:05 AM ?

## 2021-11-09 NOTE — ED Notes (Signed)
Tele-neuro remains in process.  ?

## 2021-11-09 NOTE — Assessment & Plan Note (Signed)
Continue Topamax, Mobic and Cymbalta pending verification ?

## 2021-11-09 NOTE — Progress Notes (Signed)
0050-Code stroke activated, patient already in CT ?0100-Dr. Senaida Ores joined teleneuro cart ?0102-pt arrived back to room from CT ?

## 2021-11-09 NOTE — ED Notes (Signed)
Not a candidate for TNK.  ?

## 2021-11-09 NOTE — ED Notes (Signed)
Patient transported to CT on EMS stretcher with this nurse.  ?

## 2021-11-09 NOTE — ED Provider Notes (Signed)
? ?[facility] ?Provider Note ? ? ? Event Date/Time  ? First MD Initiated Contact with Patient 11/09/21 0049   ?  (approximate) ? ? ?History  ? ?Weakness ? ? ?HPI ? ?Tracy Robertson is a 46 y.o. female brought to the ED via EMS from home with a chief complaint of left-sided weakness.  Patient went to bed around 8:30 PM and awoke approximately 1 hour prior to arrival with left-sided weakness.  She has recently been seen in the ED on 4/15 for left truncal shingles and most recently yesterday for diverticulitis and conjunctivitis.  No prior history of stroke.  Does not take anticoagulation.  Denies slurred speech, facial droop, altered mentation, vision changes, numbness or tingling.  Denies recent fever, cough, chest pain, shortness of breath. ? ? ?Past Medical History  ? ?Past Medical History:  ?Diagnosis Date  ? Hypertension   ? ? ? ?Active Problem List  ? ?Patient Active Problem List  ? Diagnosis Date Noted  ? Acute focal neurologic deficit with complete resolution 11/09/2021  ? Mild intermittent asthma without complication 05/15/2020  ? Pain medication agreement signed 10/25/2019  ? Bilateral occipital neuralgia 06/24/2019  ? Bilateral carpal tunnel syndrome 02/04/2019  ? Spondylosis of lumbar region without myelopathy or radiculopathy 08/26/2018  ? Chronic pain syndrome 06/22/2018  ? Joint pain 06/22/2018  ? Chronic back pain greater than 3 months duration 05/18/2018  ? DDD (degenerative disc disease), lumbosacral 09/08/2017  ? Obesity (BMI 35.0-39.9 without comorbidity) 12/22/2016  ? Numbness and tingling 11/25/2016  ? Other muscle spasm 11/25/2016  ? Benign essential hypertension 01/17/2016  ? Elevated LDL cholesterol level 01/17/2016  ? Gastroesophageal reflux disease with hiatal hernia 01/17/2016  ? Epigastric pain 07/27/2015  ? Dysmenorrhea 08/18/2011  ? ? ? ?Past Surgical History  ? ?Past Surgical History:  ?Procedure Laterality Date  ? ABDOMINAL HYSTERECTOMY    ? TONSILLECTOMY     ? ? ? ?Home Medications  ? ?Prior to Admission medications   ?Medication Sig Start Date End Date Taking? Authorizing Provider  ?acyclovir (ZOVIRAX) 400 MG tablet Take 2 tablets (800 mg total) by mouth 5 (five) times daily. 10/26/21   Menshew, Charlesetta Ivory, PA-C  ?albuterol (VENTOLIN HFA) 108 (90 Base) MCG/ACT inhaler Inhale into the lungs. 03/25/19   [provider]  ?amoxicillin-clavulanate (AUGMENTIN) 875-125 MG tablet Take 1 tablet by mouth 2 (two) times daily for 5 days. 11/08/21 11/13/21  Chesley Noon, MD  ?DULoxetine (CYMBALTA) 60 MG capsule Take by mouth. 10/25/19 10/24/20  [provider]  ?erythromycin ophthalmic ointment Place 1 application. into both eyes 3 (three) times daily. 11/08/21   Chesley Noon, MD  ?fluticasone Aleda Grana) 50 MCG/ACT nasal spray Place into the nose. 07/08/19   [provider]  ?losartan (COZAAR) 25 MG tablet Take 25 mg by mouth daily.    [provider]  ?meloxicam (MOBIC) 15 MG tablet TAKE 1 TABLET(15 MG) BY MOUTH DAILY 07/22/20   Felecia Shelling, DPM  ?metroNIDAZOLE (FLAGYL) 500 MG tablet Take 500 mg by mouth 2 (two) times daily. 05/18/20   [provider]  ?pantoprazole (PROTONIX) 40 MG tablet Take 40 mg by mouth 2 (two) times daily.    [provider]  ?spironolactone (ALDACTONE) 25 MG tablet Take 25 mg by mouth daily. 03/21/20   [provider]  ?topiramate (TOPAMAX) 25 MG tablet  12/30/18   [provider]  ?tretinoin (RETIN-A) 0.05 % cream APPLY NIGHTLY TO SCAP AS DIRECTED 02/13/20   [provider]  ?triamcinolone ointment (KENALOG) 0.1 % Apply 1 application. topically 2 (two) times daily. 10/26/21   Menshew, Charlesetta IvoryJenise V Bacon, PA-C  ? ? ? ?Allergies  ?Patient has no known allergies. ? ? ?Family History  ?History reviewed. No pertinent family history. ? ? ?Physical Exam  ?Triage Vital Signs: ?ED Triage Vitals  ?Enc Vitals Group  ?   BP   ?   Pulse   ?   Resp   ?   Temp   ?   Temp src   ?   SpO2   ?   Weight    ?   Height   ?   Head Circumference   ?   Peak Flow   ?   Pain Score   ?   Pain Loc   ?   Pain Edu?   ?   Excl. in GC?   ? ? ?Updated Vital Signs: ?BP (!) 128/96   Pulse 72   Temp 98 ?F (36.7 ?C) (Oral)   Resp 19   Ht 5\' 3"  (1.6 m)   Wt 90.3 kg   SpO2 97%   BMI 35.25 kg/m?  ? ? ?General: Awake, no distress.  ?CV:  RRR.  Good peripheral perfusion.  ?Resp:  Increased effort.  CTA B. ?Abd:  No distention.  ?Other:  Bilateral conjunctiva reddened.  Alert and oriented x3.  CN II toXII grossly intact.  4/5 motor strength left upper arm and leg compared to the right. ? ? ?ED Results / Procedures / Treatments  ?Labs ?(all labs ordered are listed, but only abnormal results are displayed) ?Labs Reviewed  ?URINE DRUG SCREEN, QUALITATIVE ([facility] ONLY) - Abnormal; Notable for the following components:  ?    Result Value  ? Opiate, Ur Screen POSITIVE (*)   ? All other components within normal limits  ?URINALYSIS, ROUTINE W REFLEX MICROSCOPIC - Abnormal; Notable for the following components:  ? Color, Urine YELLOW (*)   ? APPearance TURBID (*)   ? Bacteria, UA MANY (*)   ? All other components within normal limits  ?RESP PANEL BY RT-PCR (FLU A&B, COVID) ARPGX2  ?PROTIME-INR  ?APTT  ?CBC  ?DIFFERENTIAL  ?COMPREHENSIVE METABOLIC PANEL  ?ETHANOL  ?HIV ANTIBODY (ROUTINE TESTING W REFLEX)  ?LIPID PANEL  ?HEMOGLOBIN A1C  ?POC URINE PREG, ED  ?POC URINE PREG, ED  ?TROPONIN I (HIGH SENSITIVITY)  ?TROPONIN I (HIGH SENSITIVITY)  ? ? ? ?EKG ? ?ED ECG REPORT ?I, Irean HongSUNG,Tyreak Reagle J, the attending physician, personally viewed and interpreted this ECG. ? ? Date: 11/09/2021 ? EKG Time: 0106 ? Rate: 93 ? Rhythm: normal sinus rhythm ? Axis: RAD ? Intervals:none ? ST&T Change: Nonspecific ? ? ? ?RADIOLOGY ?I have independently visualized and interpreted patient's CT head as well as noted the radiology interpretation: ? ?CT head: No ICH ? ?Official radiology report(s): ?CT Abdomen Pelvis W Contrast ? ?Result Date: 11/08/2021 ?CLINICAL DATA:  A  46 year old female presents with LEFT lower quadrant abdominal pain. EXAM: CT ABDOMEN AND PELVIS WITH CONTRAST TECHNIQUE: Multidetector CT imaging of the abdomen and pelvis was performed using the standard protocol following bolus administration of intravenous contrast. RADIATION DOSE REDUCTION: This exam was performed according to the departmental dose-optimization program which includes automated exposure control, adjustment of the mA and/or kV according to patient size and/or use of iterative reconstruction technique. CONTRAST:  100mL OMNIPAQUE IOHEXOL 300 MG/ML  SOLN COMPARISON:  August 04, 2017. FINDINGS: Lower chest: Lung bases are clear.  No effusion or consolidation.  Hepatobiliary: No focal, suspicious hepatic lesion. No pericholecystic stranding. No biliary duct dilation. Portal vein is patent. Signs of moderate hepatic steatosis with fat intensification about the fissure for false form ligament finding that is unchanged. Pancreas: Normal, without mass, inflammation or ductal dilatation. Spleen: Normal. Adrenals/Urinary Tract: Adrenal glands are unremarkable. Symmetric renal enhancement. No sign of hydronephrosis. No suspicious renal lesion or perinephric stranding. Urinary bladder is grossly unremarkable. 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 Diverticulosis from descending colon through the sigmoid colon. No pericolonic stranding. No acute gastrointestinal process. Appendix not visualized but no secondary signs to suggest acute appendicitis. Stool in the ascending colon is moderate. Vascular/Lymphatic: Aorta with smooth contours. IVC with smooth contours. No aneurysmal dilation of the abdominal aorta. There is no gastrohepatic or hepatoduodenal ligament lymphadenopathy. No retroperitoneal or mesenteric lymphadenopathy. No pelvic sidewall lymphadenopathy. Reproductive: No sign of adnexal mass.  Post hysterectomy. Other: No ascites.  No pneumoperitoneum. Musculoskeletal: No acute bone finding.  No destructive bone process. Spinal degenerative changes. IMPRESSION: 1. No definite acute intra-abdominal or pelvic process. 2. Diverticular disease of the sigmoid with perhaps slightly increased segmental thicken

## 2021-11-09 NOTE — Plan of Care (Signed)
Patient is being discharged home to self care. IV has been removed. Discharge instruction given and patient educated. Husband is at bedside for transportation home.  ?

## 2021-11-10 LAB — URINE CULTURE: Culture: <10000 — AB

## 2022-05-02 ENCOUNTER — Ambulatory Visit
Admission: EM | Admit: 2022-05-02 | Discharge: 2022-05-02 | Disposition: A | Discharge status: Home / Self Care | Payer: 59 | Attending: Emergency Medicine | Admitting: Emergency Medicine

## 2022-05-02 DIAGNOSIS — I10 Essential (primary) hypertension: Secondary | ICD-10-CM | POA: Diagnosis not present

## 2022-05-02 DIAGNOSIS — Z1152 Encounter for screening for COVID-19: Secondary | ICD-10-CM | POA: Diagnosis not present

## 2022-05-02 DIAGNOSIS — R5383 Other fatigue: Secondary | ICD-10-CM | POA: Insufficient documentation

## 2022-05-02 DIAGNOSIS — R509 Fever, unspecified: Secondary | ICD-10-CM | POA: Insufficient documentation

## 2022-05-02 DIAGNOSIS — J029 Acute pharyngitis, unspecified: Secondary | ICD-10-CM | POA: Diagnosis not present

## 2022-05-02 DIAGNOSIS — B349 Viral infection, unspecified: Secondary | ICD-10-CM | POA: Insufficient documentation

## 2022-05-02 LAB — POCT RAPID STREP A (OFFICE): Rapid Strep A Screen: NEGATIVE

## 2022-05-02 LAB — RESP PANEL BY RT-PCR (FLU A&B, COVID) ARPGX2
Influenza A by PCR: NEGATIVE
Influenza B by PCR: NEGATIVE
SARS Coronavirus 2 by RT PCR: NEGATIVE

## 2022-05-02 NOTE — ED Provider Notes (Signed)
Renaldo Fiddler    CSN: [id number] Arrival date & time: 05/02/22  1128      History   Chief Complaint Chief Complaint  Patient presents with   Sore Throat    HPI Tracy Robertson is a 46 y.o. female.  Patient presents with subjective fever, sore throat, mild cough, fatigue, body aches x3 days.  Various OTC treatments attempted; DayQuil taken this morning.  No rash, shortness of breath, vomiting, diarrhea, or other symptoms.  Her medical history includes hypertension.  The history is provided by the patient and medical records.    Past Medical History:  Diagnosis Date   Hypertension     Patient Active Problem List   Diagnosis Date Noted   Acute focal neurologic deficit with complete resolution 11/09/2021   Dysuria 11/09/2021   Mild intermittent asthma without complication 05/15/2020   Pain medication agreement signed 10/25/2019   Bilateral occipital neuralgia 06/24/2019   Bilateral carpal tunnel syndrome 02/04/2019   Spondylosis of lumbar region without myelopathy or radiculopathy 08/26/2018   Chronic pain syndrome 06/22/2018   Joint pain 06/22/2018   Chronic back pain greater than 3 months duration 05/18/2018   DDD (degenerative disc disease), lumbosacral 09/08/2017   Obesity (BMI 35.0-39.9 without comorbidity) 12/22/2016   Numbness and tingling 11/25/2016   Other muscle spasm 11/25/2016   Benign essential hypertension 01/17/2016   Elevated LDL cholesterol level 01/17/2016   Gastroesophageal reflux disease with hiatal hernia 01/17/2016   Epigastric pain 07/27/2015   Dysmenorrhea 08/18/2011    Past Surgical History:  Procedure Laterality Date   ABDOMINAL HYSTERECTOMY     TONSILLECTOMY      OB History   No obstetric history on file.      Home Medications    Prior to Admission medications   Medication Sig Start Date End Date Taking? Authorizing Provider  propranolol (INDERAL) 10 MG tablet Take by mouth. 11/22/21  Yes [provider]   albuterol (VENTOLIN HFA) 108 (90 Base) MCG/ACT inhaler Inhale into the lungs. 03/25/19   [provider]  DULoxetine (CYMBALTA) 60 MG capsule Take by mouth. 10/25/19 10/24/20  [provider]  erythromycin ophthalmic ointment Place 1 application. into both eyes 3 (three) times daily. 11/08/21   Chesley Noon, MD  fluticasone (FLONASE) 50 MCG/ACT nasal spray Place into the nose. 07/08/19   [provider]  losartan (COZAAR) 25 MG tablet Take 25 mg by mouth daily.    [provider]  meloxicam (MOBIC) 15 MG tablet TAKE 1 TABLET(15 MG) BY MOUTH DAILY 07/22/20   Felecia Shelling, DPM  metroNIDAZOLE (FLAGYL) 500 MG tablet Take 500 mg by mouth 2 (two) times daily. 05/18/20   [provider]  pantoprazole (PROTONIX) 40 MG tablet Take 40 mg by mouth 2 (two) times daily.    [provider]  spironolactone (ALDACTONE) 25 MG tablet Take 25 mg by mouth daily. 03/21/20   [provider]  topiramate (TOPAMAX) 25 MG tablet  12/30/18   [provider]  tretinoin (RETIN-A) 0.05 % cream APPLY NIGHTLY TO SCAP AS DIRECTED 02/13/20   [provider]  triamcinolone ointment (KENALOG) 0.1 % Apply 1 application. topically 2 (two) times daily. 10/26/21   Menshew, Charlesetta Ivory, PA-C    Family History History reviewed. No pertinent family history.  Social History Social History   Tobacco Use   Smoking status: Never   Smokeless tobacco: Never  Vaping Use   Vaping Use: Every day   Substances: Nicotine  Substance Use  Topics   Alcohol use: No   Drug use: No     Allergies   Patient has no known allergies.   Review of Systems Review of Systems  Constitutional:  Positive for fatigue and fever. Negative for chills.  HENT:  Positive for sore throat. Negative for ear pain.   Eyes:  Negative for visual disturbance.  Respiratory:  Positive for cough. Negative for shortness of breath.   Cardiovascular:  Negative for chest pain and  palpitations.  Gastrointestinal:  Negative for abdominal pain, diarrhea and vomiting.  Skin:  Negative for rash.  All other systems reviewed and are negative.    Physical Exam Triage Vital Signs ED Triage Vitals [05/02/22 1155]  Enc Vitals Group     BP 136/78     Pulse Rate 86     Resp 18     Temp 98.1 F (36.7 C)     Temp src      SpO2 98 %     Weight 197 lb (89.4 kg)     Height 5\' 3"  (1.6 m)     Head Circumference      Peak Flow      Pain Score 10     Pain Loc      Pain Edu?      Excl. in [place]?    No data found.  Updated Vital Signs BP 136/78   Pulse 86   Temp 98.1 F (36.7 C)   Resp 18   Ht 5\' 3"  (1.6 m)   Wt 197 lb (89.4 kg)   SpO2 98%   BMI 34.90 kg/m   Visual Acuity Right Eye Distance:   Left Eye Distance:   Bilateral Distance:    Right Eye Near:   Left Eye Near:    Bilateral Near:     Physical Exam Vitals and nursing note reviewed.  Constitutional:      General: She is not in acute distress.    Appearance: Normal appearance. She is well-developed. She is ill-appearing.  HENT:     Right Ear: Tympanic membrane normal.     Left Ear: Tympanic membrane normal.     Nose: Nose normal.     Mouth/Throat:     Mouth: Mucous membranes are moist.     Pharynx: Oropharynx is clear.  Cardiovascular:     Rate and Rhythm: Normal rate and regular rhythm.     Heart sounds: Normal heart sounds.  Pulmonary:     Effort: Pulmonary effort is normal. No respiratory distress.     Breath sounds: Normal breath sounds.  Musculoskeletal:     Cervical back: Neck supple.  Skin:    General: Skin is warm and dry.  Neurological:     Mental Status: She is alert.  Psychiatric:        Mood and Affect: Mood normal.        Behavior: Behavior normal.      UC Treatments / Results  Labs (all labs ordered are listed, but only abnormal results are displayed) Labs Reviewed  RESP PANEL BY RT-PCR (FLU A&B, COVID) ARPGX2  POCT RAPID STREP A (OFFICE)     EKG   Radiology No results found.  Procedures Procedures (including critical care time)  Medications Ordered in UC Medications - No data to display  Initial Impression / Assessment and Plan / UC Course  I have reviewed the triage vital signs and the nursing notes.  Pertinent labs & imaging results that were available during my care of the  patient were reviewed by me and considered in my medical decision making (see chart for details).    Viral illness.  Rapid strep negative.  COVID and Flu pending.  Discussed symptomatic treatment including Tylenol or ibuprofen, rest, hydration.  Instructed patient to follow up with her PCP if symptoms are not improving.  She agrees to plan of care.   Final Clinical Impressions(s) / UC Diagnoses   Final diagnoses:  Viral illness     Discharge Instructions      Your COVID and Flu tests are pending.    Take Tylenol or ibuprofen as needed for fever or discomfort.  Rest and keep yourself hydrated.    Follow-up with your primary care provider if your symptoms are not improving.         ED Prescriptions   None    PDMP not reviewed this encounter.   Mickie Bail, NP 05/02/22 1226

## 2022-05-02 NOTE — Discharge Instructions (Addendum)
Your COVID and Flu tests are pending.    Take Tylenol or ibuprofen as needed for fever or discomfort.  Rest and keep yourself hydrated.    Follow-up with your primary care provider if your symptoms are not improving.     

## 2022-05-02 NOTE — ED Triage Notes (Signed)
Patient to Urgent Care with complaints of sore throat and fevers x3 days, reports last fever was 5am this morning. Also reports feeling weak and fatigued, has been taking dayquil/ nyquil/ tylenol.

## 2022-05-03 ENCOUNTER — Emergency Department
Admission: EM | Admit: 2022-05-03 | Discharge: 2022-05-03 | Disposition: A | Discharge status: Home / Self Care | Payer: 59 | Attending: Emergency Medicine | Admitting: Emergency Medicine

## 2022-05-03 ENCOUNTER — Encounter: Payer: Self-pay | Admitting: Emergency Medicine

## 2022-05-03 DIAGNOSIS — J029 Acute pharyngitis, unspecified: Secondary | ICD-10-CM | POA: Insufficient documentation

## 2022-05-03 DIAGNOSIS — Z202 Contact with and (suspected) exposure to infections with a predominantly sexual mode of transmission: Secondary | ICD-10-CM

## 2022-05-03 DIAGNOSIS — R509 Fever, unspecified: Secondary | ICD-10-CM | POA: Diagnosis not present

## 2022-05-03 DIAGNOSIS — M791 Myalgia, unspecified site: Secondary | ICD-10-CM | POA: Insufficient documentation

## 2022-05-03 LAB — URINALYSIS, ROUTINE W REFLEX MICROSCOPIC
Bilirubin Urine: NEGATIVE
Glucose, UA: NEGATIVE mg/dL
Hgb urine dipstick: NEGATIVE
Ketones, ur: NEGATIVE mg/dL
Nitrite: NEGATIVE
Protein, ur: 100 mg/dL — AB
Specific Gravity, Urine: 1.015 (ref 1.005–1.030)
pH: 5 (ref 5.0–8.0)

## 2022-05-03 LAB — COMPREHENSIVE METABOLIC PANEL
ALT: 20 U/L (ref 0–44)
AST: 21 U/L (ref 15–41)
Albumin: 3.7 g/dL (ref 3.5–5.0)
Alkaline Phosphatase: 58 U/L (ref 38–126)
Anion gap: 7 (ref 5–15)
BUN: 10 mg/dL (ref 6–20)
CO2: 26 mmol/L (ref 22–32)
Calcium: 8.6 mg/dL — ABNORMAL LOW (ref 8.9–10.3)
Chloride: 105 mmol/L (ref 98–111)
Creatinine, Ser: 0.87 mg/dL (ref 0.44–1.00)
GFR, Estimated: >60 mL/min (ref >60)
Glucose, Bld: 141 mg/dL — ABNORMAL HIGH (ref 70–99)
Potassium: 3.5 mmol/L (ref 3.5–5.1)
Sodium: 138 mmol/L (ref 135–145)
Total Bilirubin: 0.4 mg/dL (ref 0.3–1.2)
Total Protein: 7.1 g/dL (ref 6.5–8.1)

## 2022-05-03 LAB — CHLAMYDIA/NGC RT PCR (ARMC ONLY)
Chlamydia Tr: NOT DETECTED
Chlamydia Tr: NOT DETECTED
N gonorrhoeae: NOT DETECTED
N gonorrhoeae: NOT DETECTED

## 2022-05-03 LAB — POC URINE PREG, ED: Preg Test, Ur: NEGATIVE

## 2022-05-03 LAB — CBC
HCT: 43.2 % (ref 36.0–46.0)
Hemoglobin: 13.9 g/dL (ref 12.0–15.0)
MCH: 30.5 pg (ref 26.0–34.0)
MCHC: 32.2 g/dL (ref 30.0–36.0)
MCV: 94.7 fL (ref 80.0–100.0)
Platelets: 266 10*3/uL (ref 150–400)
RBC: 4.56 MIL/uL (ref 3.87–5.11)
RDW: 13 % (ref 11.5–15.5)
WBC: 6.7 10*3/uL (ref 4.0–10.5)
nRBC: 0 % (ref 0.0–0.2)

## 2022-05-03 LAB — LIPASE, BLOOD: Lipase: 32 U/L (ref 11–51)

## 2022-05-03 LAB — HIV ANTIBODY (ROUTINE TESTING W REFLEX): HIV Screen 4th Generation wRfx: NONREACTIVE

## 2022-05-03 MED ORDER — DOXYCYCLINE HYCLATE 100 MG PO CAPS: 100.0000 mg | ORAL_CAPSULE | Freq: Two times a day (BID) | ORAL | 0 refills | Status: AC

## 2022-05-03 MED ORDER — LIDOCAINE HCL (PF) 1 % IJ SOLN
2.0000 mL | Freq: Once | INTRAMUSCULAR | Status: AC
Start: 2022-05-03 — End: 2022-05-03
  Administered 2022-05-03: 1 mL
  Filled 2022-05-03: qty 5

## 2022-05-03 MED ORDER — ONDANSETRON 4 MG PO TBDP
4.0000 mg | ORAL_TABLET | Freq: Three times a day (TID) | ORAL | 0 refills | Status: DC | PRN
Start: 2022-05-03 — End: 2022-09-03

## 2022-05-03 MED ORDER — CEFTRIAXONE SODIUM 250 MG IJ SOLR
250.0000 mg | Freq: Once | INTRAMUSCULAR | Status: AC
  Administered 2022-05-03: 250 mg via INTRAMUSCULAR
  Filled 2022-05-03: qty 250

## 2022-05-03 MED ORDER — DOXYCYCLINE HYCLATE 100 MG PO TABS
100.0000 mg | ORAL_TABLET | Freq: Once | ORAL | Status: AC
  Administered 2022-05-03: 100 mg via ORAL
  Filled 2022-05-03: qty 1

## 2022-05-03 NOTE — ED Provider Notes (Signed)
[facility] Provider Note    Event Date/Time   First MD Initiated Contact with Patient 05/03/22 [phone]     (approximate)   History   Abdominal Pain   HPI  Tracy Robertson is a 46 y.o. female here with sore throat, fever, body aches.  The patient states that for the last week, she has had sore throat, body aches, and subjective fevers.  She has not had any cough.  She is felt some tender lymph nodes in her anterior neck.  She is felt generally fatigued and had chills and night sweats.  She states that she asked her husband if he possibly could be sick as he had been acting differently this week, and he reported that he had cheated on her with an unknown partner about 2 weeks ago.  She states that her symptoms began after she and her husband attempted to have sex over a week ago, during which she attempted to have oral sex.  She states that her throat has hurt since then.  She denies any vaginal discharge, but has noticed that she had some mild dysuria although she is unsure whether this is due to dehydration.  Denies any vaginal bleeding or discharge.  Denies any cough.  No history of STIs or STDs.  No other medical complaints.     Physical Exam   Triage Vital Signs: ED Triage Vitals  Enc Vitals Group     BP 05/03/22 0450 (!) 137/97     Pulse Rate 05/03/22 0450 92     Resp 05/03/22 0450 20     Temp 05/03/22 0450 98.5 F (36.9 C)     Temp Source 05/03/22 0450 Oral     SpO2 05/03/22 0450 96 %     Weight 05/03/22 0444 197 lb 5 oz (89.5 kg)     Height 05/03/22 0444 5\' 3"  (1.6 m)     Head Circumference --      Peak Flow --      Pain Score 05/03/22 0443 7     Pain Loc --      Pain Edu? --      Excl. in GC? --     Most recent vital signs: Vitals:   05/03/22 0450 05/03/22 0820  BP: (!) 137/97 (!) 133/90  Pulse: 92 80  Resp: 20 20  Temp: 98.5 F (36.9 C)   SpO2: 96% 98%     General: Awake, no distress.  CV:  Good peripheral perfusion.  Regular  rate and rhythm. Resp:  Normal effort.  Lungs clear to auscultation bilaterally. Abd:  No distention.  No significant tenderness.  No rebound or guarding. Other:  Moderate posterior pharyngeal erythema, with significant peritonsillar erythema, status post tonsillectomy.  Uvula is midline.  Tongue normal.  No appreciable exudates although she is status post colectomy as mentioned.  Mildly tender anterior cervical lymphadenopathy.  ED Results / Procedures / Treatments   Labs (all labs ordered are listed, but only abnormal results are displayed) Labs Reviewed  COMPREHENSIVE METABOLIC PANEL - Abnormal; Notable for the following components:      Result Value   Glucose, Bld 141 (*)    Calcium 8.6 (*)    All other components within normal limits  URINALYSIS, ROUTINE W REFLEX MICROSCOPIC - Abnormal; Notable for the following components:   Color, Urine YELLOW (*)    APPearance CLEAR (*)    Protein, ur 100 (*)    Leukocytes,Ua TRACE (*)    Bacteria, UA  RARE (*)    All other components within normal limits  CHLAMYDIA/NGC RT PCR ([facility] ONLY)            CHLAMYDIA/NGC RT PCR ([facility] ONLY)            LIPASE, BLOOD  CBC  HIV ANTIBODY (ROUTINE TESTING W REFLEX)  RPR  POC URINE PREG, ED     EKG    RADIOLOGY    I also independently reviewed and agree with radiologist interpretations.   PROCEDURES:  Critical Care performed: No  MEDICATIONS ORDERED IN ED: Medications  cefTRIAXone (ROCEPHIN) injection 250 mg (250 mg Intramuscular Given 05/03/22 0839)  doxycycline (VIBRA-TABS) tablet 100 mg (100 mg Oral Given 05/03/22 0840)  lidocaine (PF) (XYLOCAINE) 1 % injection 2 mL (1 mL Other Given 05/03/22 0839)     IMPRESSION / MDM / ASSESSMENT AND PLAN / ED COURSE  I reviewed the triage vital signs and the nursing notes.                               Ddx:  Differential includes the following, with pertinent life- or limb-threatening emergencies considered:  Viral or bacterial  pharyngitis, chlamydia or gonorrhea will pharyngitis, acute HIV, syphilis, other viral URI, no signs of PTA or RPA  Patient's presentation is most consistent with acute presentation with potential threat to life or bodily function.  MDM:  46 year old female here with sore throat, body aches, fatigue.  Patient has a potential STI exposure.  Clinically, suspect possible viral pharyngitis, less likely but possibly chlamydial or gonorrheal pharyngitis.  Difficult to assess given that she has had her tonsils removed.  Swabs sent.  Discussed differential and will treat empirically as well as an outpatient HIV.  No evidence suggest retropharyngeal or peritonsillar abscess.  Sublingual space is soft.  Tolerating p.o.  No evidence of systemic infection.  She is tolerating p.o.  CBC shows no leukocytosis or anemia.  CMP unremarkable.  Lipase is normal.   MEDICATIONS GIVEN IN ED: Medications  cefTRIAXone (ROCEPHIN) injection 250 mg (250 mg Intramuscular Given 05/03/22 0839)  doxycycline (VIBRA-TABS) tablet 100 mg (100 mg Oral Given 05/03/22 0840)  lidocaine (PF) (XYLOCAINE) 1 % injection 2 mL (1 mL Other Given 05/03/22 0839)     Consults:     EMR reviewed       FINAL CLINICAL IMPRESSION(S) / ED DIAGNOSES   Final diagnoses:  Pharyngitis, unspecified etiology  Possible exposure to STD     Rx / DC Orders   ED Discharge Orders          Ordered    doxycycline (VIBRAMYCIN) 100 MG capsule  2 times daily        05/03/22 0830    ondansetron (ZOFRAN-ODT) 4 MG disintegrating tablet  Every 8 hours PRN        05/03/22 0830             Note:  This document was prepared using Dragon voice recognition software and may include unintentional dictation errors.   Duffy Bruce, MD 05/03/22 [phone]

## 2022-05-03 NOTE — ED Triage Notes (Signed)
Pt presents via POV with complaints of lower abdominal pain that started 3 days ago. Pt has had some dysuria and would like STI testing. No meds taken PTA. Denies fevers, N/V/D, hematuria, CP or SOB.

## 2022-05-03 NOTE — ED Notes (Signed)
Pt refuses to stay for med hold. Son waiting for her in car. Pt instructed to return with any concerning symptoms of med intolerance such as hives, wheezing, SOB. Steady gait noted.

## 2022-05-03 NOTE — Discharge Instructions (Signed)
Take the full course of antibiotic as prescribed. This will treat any kind of bacterial infection that could be causing your symptoms.  Take over-the-counter Tylenol 1000 mg every 4-6 hours and Ibuprofen 600 mg every 6 hours for pain/fever/aches.   Drink at least 6-8 glasses of water daily.  We have sent additional tests today which should return over the next 2-3 days. Check your MyChart for results and if positive we will call you.

## 2022-05-03 NOTE — ED Notes (Signed)
Pt was already stuck for HIV test, then order placed for RPR. Attempted to call lab, no one picking up either number.

## 2022-05-04 LAB — RPR: RPR Ser Ql: NONREACTIVE

## 2022-05-21 ENCOUNTER — Encounter: Payer: Self-pay | Admitting: Emergency Medicine

## 2022-05-21 ENCOUNTER — Other Ambulatory Visit: Payer: Self-pay

## 2022-05-21 DIAGNOSIS — I10 Essential (primary) hypertension: Secondary | ICD-10-CM | POA: Diagnosis not present

## 2022-05-21 DIAGNOSIS — M436 Torticollis: Secondary | ICD-10-CM | POA: Insufficient documentation

## 2022-05-21 DIAGNOSIS — Z79899 Other long term (current) drug therapy: Secondary | ICD-10-CM | POA: Diagnosis not present

## 2022-05-21 NOTE — ED Triage Notes (Signed)
Patient ambulatory to triage with steady gait, without difficulty or distress noted; pt reports rt sided neck pain with ROM x 2wks; denies any injury

## 2022-05-22 ENCOUNTER — Emergency Department
Admission: EM | Admit: 2022-05-22 | Discharge: 2022-05-22 | Disposition: A | Discharge status: Home / Self Care | Payer: 59 | Attending: Emergency Medicine | Admitting: Emergency Medicine

## 2022-05-22 DIAGNOSIS — M436 Torticollis: Secondary | ICD-10-CM

## 2022-05-22 MED ORDER — MELOXICAM 7.5 MG PO TABS
15.0000 mg | ORAL_TABLET | Freq: Once | ORAL | Status: AC
Start: 2022-05-22 — End: 2022-05-22
  Administered 2022-05-22: 15 mg via ORAL
  Filled 2022-05-22: qty 2

## 2022-05-22 MED ORDER — LIDOCAINE 5 % EX PTCH: 1.0000 | MEDICATED_PATCH | CUTANEOUS | 0 refills | Status: AC

## 2022-05-22 MED ORDER — ONDANSETRON 4 MG PO TBDP
4.0000 mg | ORAL_TABLET | Freq: Once | ORAL | Status: AC
  Administered 2022-05-22: 4 mg via ORAL
  Filled 2022-05-22: qty 1

## 2022-05-22 MED ORDER — LIDOCAINE 5 % EX PTCH
1.0000 | MEDICATED_PATCH | CUTANEOUS | Status: DC
  Administered 2022-05-22: 1 via TRANSDERMAL
  Filled 2022-05-22: qty 1

## 2022-05-22 MED ORDER — MELOXICAM 15 MG PO TABS: 15.0000 mg | ORAL_TABLET | Freq: Every day | ORAL | 0 refills | Status: AC

## 2022-05-22 MED ORDER — METHOCARBAMOL 500 MG PO TABS: 500.0000 mg | ORAL_TABLET | Freq: Three times a day (TID) | ORAL | 0 refills | Status: DC | PRN

## 2022-05-22 MED ORDER — HYDROCODONE-ACETAMINOPHEN 5-325 MG PO TABS
2.0000 | ORAL_TABLET | Freq: Once | ORAL | Status: AC
  Administered 2022-05-22: 2 via ORAL
  Filled 2022-05-22: qty 2

## 2022-05-22 NOTE — Discharge Instructions (Addendum)
I recommend regular massage, gentle stretching, using muscle relaxers as needed.  Please keep your appointment with pain management as scheduled on Friday.  Please continue your Mobic as prescribed.  We have provided you with a refill for this medication.

## 2022-05-22 NOTE — ED Provider Notes (Signed)
[facility] Provider Note    Event Date/Time   First MD Initiated Contact with Patient 05/22/22 0125     (approximate)   History   Torticollis   HPI  Tracy Robertson is a 46 y.o. female with history of hypertension, chronic pain seeing [facility] pain management who presents to the emergency department with 2 weeks of right-sided neck pain.  No injury.  It is stiff and tender to palpation and worse with movement.  No fevers.  No numbness, tingling, weakness, bowel or bladder incontinence, urinary retention, difficulty ambulating.  No previous neck surgery.  Taking Tylenol, ibuprofen over-the-counter without relief.  States she missed her last pain management appointment and is out of hydrocodone.  Has an appointment to see pain management tomorrow.   History provided by patient and significant other.    Past Medical History:  Diagnosis Date   Hypertension     Past Surgical History:  Procedure Laterality Date   ABDOMINAL HYSTERECTOMY     TONSILLECTOMY      MEDICATIONS:  Prior to Admission medications   Medication Sig Start Date End Date Taking? Authorizing Provider  albuterol (VENTOLIN HFA) 108 (90 Base) MCG/ACT inhaler Inhale into the lungs. 03/25/19   [provider]  DULoxetine (CYMBALTA) 60 MG capsule Take by mouth. 10/25/19 10/24/20  [provider]  erythromycin ophthalmic ointment Place 1 application. into both eyes 3 (three) times daily. 11/08/21   Chesley Noon, MD  fluticasone (FLONASE) 50 MCG/ACT nasal spray Place into the nose. 07/08/19   [provider]  losartan (COZAAR) 25 MG tablet Take 25 mg by mouth daily.    [provider]  meloxicam (MOBIC) 15 MG tablet TAKE 1 TABLET(15 MG) BY MOUTH DAILY 07/22/20   Felecia Shelling, DPM  metroNIDAZOLE (FLAGYL) 500 MG tablet Take 500 mg by mouth 2 (two) times daily. 05/18/20   [provider]  ondansetron (ZOFRAN-ODT) 4 MG disintegrating tablet Take 1 tablet (4  mg total) by mouth every 8 (eight) hours as needed for nausea or vomiting. 05/03/22   Shaune Pollack, MD  pantoprazole (PROTONIX) 40 MG tablet Take 40 mg by mouth 2 (two) times daily.    [provider]  propranolol (INDERAL) 10 MG tablet Take by mouth. 11/22/21   [provider]  spironolactone (ALDACTONE) 25 MG tablet Take 25 mg by mouth daily. 03/21/20   [provider]  topiramate (TOPAMAX) 25 MG tablet  12/30/18   [provider]  tretinoin (RETIN-A) 0.05 % cream APPLY NIGHTLY TO SCAP AS DIRECTED 02/13/20   [provider]  triamcinolone ointment (KENALOG) 0.1 % Apply 1 application. topically 2 (two) times daily. 10/26/21   Menshew, Charlesetta Ivory, PA-C    Physical Exam   Triage Vital Signs: ED Triage Vitals  Enc Vitals Group     BP 05/22/22 0002 (!) 140/94     Pulse Rate 05/22/22 0002 77     Resp 05/22/22 0002 18     Temp 05/22/22 0002 98.5 F (36.9 C)     Temp Source 05/22/22 0002 Oral     SpO2 05/22/22 0002 99 %     Weight 05/21/22 2349 195 lb (88.5 kg)     Height 05/21/22 2349 5\' 3"  (1.6 m)     Head Circumference --      Peak Flow --      Pain Score 05/21/22 2349 9     Pain Loc --      Pain Edu? --  Excl. in [place]? --     Most recent vital signs: Vitals:   05/22/22 0002 05/22/22 0158  BP: (!) 140/94 137/88  Pulse: 77 75  Resp: 18 14  Temp: 98.5 F (36.9 C) 98.5 F (36.9 C)  SpO2: 99% 99%     CONSTITUTIONAL: Alert and responds appropriately to questions. Well-appearing; well-nourished HEAD: Normocephalic, atraumatic EYES: Conjunctivae clear, pupils appear equal ENT: normal nose; moist mucous membranes NECK: Patient has pain with turning her neck right or left.  She is tender to palpation over the right trapezius muscle and this muscle is tight with no overlying skin changes including no redness, warmth, ecchymosis.  There is no midline spinal tenderness, step-off or deformity noted. CARD: Regular rate and rhythm RESP:  Normal chest excursion without splinting or tachypnea; no hypoxia or respiratory distress, speaking full sentences ABD/GI: non-distended EXT: Normal ROM in all joints, no major deformities noted SKIN: Normal color for age and race, no rashes on exposed skin NEURO: Moves all extremities equally, normal speech, no facial asymmetry noted, normal sensation, no saddle anesthesia, ambulates with normal gait PSYCH: The patient's mood and manner are appropriate. Grooming and personal hygiene are appropriate.  ED Results / Procedures / Treatments   LABS: (all labs ordered are listed, but only abnormal results are displayed) Labs Reviewed - No data to display   EKG:   RADIOLOGY: My personal review and interpretation of imaging:    I have personally reviewed all radiology reports. No results found.   PROCEDURES:  Critical Care performed: No     Procedures    IMPRESSION / MDM / ASSESSMENT AND PLAN / ED COURSE  I reviewed the triage vital signs and the nursing notes.   Patient here with torticollis, right-sided trapezius pain     DIFFERENTIAL DIAGNOSIS (includes but not limited to):   Torticollis, muscle spasm, muscle strain, doubt cervical myelopathy, epidural abscess or hematoma, discitis or osteomyelitis, fracture, radiculopathy  Patient's presentation is most consistent with acute complicated illness / injury requiring diagnostic workup.  PLAN: Patient here with right-sided neck pain.  Recommended gentle stretching, massage, alternating heat and ice.  Will discharge with Lidoderm patches, refill of her Mobic, and muscle relaxers.  We will give pain medication here but discussed with patient that given she has a pain contract and is seeing her pain management specialist tomorrow that they will need to refill her hydrocodone.  She verbalized understanding.  No indication for emergent spinal imaging at this time.  Discussed return precautions.   MEDICATIONS GIVEN IN  ED: Medications  lidocaine (LIDODERM) 5 % 1 patch (1 patch Transdermal Patch Applied 05/22/22 0148)  meloxicam (MOBIC) tablet 15 mg (15 mg Oral Given 05/22/22 0148)  HYDROcodone-acetaminophen (NORCO/VICODIN) 5-325 MG per tablet 2 tablet (2 tablets Oral Given 05/22/22 0148)  ondansetron (ZOFRAN-ODT) disintegrating tablet 4 mg (4 mg Oral Given 05/22/22 0148)     ED COURSE:  At this time, I do not feel there is any life-threatening condition present. I reviewed all nursing notes, vitals, pertinent previous records.  All lab and urine results, EKGs, imaging ordered have been independently reviewed and interpreted by myself.  I reviewed all available radiology reports from any imaging ordered this visit.  Based on my assessment, I feel the patient is safe to be discharged home without further emergent workup and can continue workup as an outpatient as needed. Discussed all findings, treatment plan as well as usual and customary return precautions.  They verbalize understanding and are comfortable with this  plan.  Outpatient follow-up has been provided as needed.  All questions have been answered.    CONSULTS:  none   OUTSIDE RECORDS REVIEWED: Reviewed previous notes from [facility] pain clinic.     FINAL CLINICAL IMPRESSION(S) / ED DIAGNOSES   Final diagnoses:  Torticollis     Rx / DC Orders   ED Discharge Orders          Ordered    methocarbamol (ROBAXIN) 500 MG tablet  Every 8 hours PRN        05/22/22 0139    lidocaine (LIDODERM) 5 %  Every 24 hours        05/22/22 0139    meloxicam (MOBIC) 15 MG tablet  Daily        05/22/22 0139             Note:  This document was prepared using Dragon voice recognition software and may include unintentional dictation errors.   Sura Canul, Layla Maw, DO 05/22/22 [phone]

## 2022-06-13 ENCOUNTER — Ambulatory Visit (INDEPENDENT_AMBULATORY_CARE_PROVIDER_SITE_OTHER): Payer: 59

## 2022-06-13 ENCOUNTER — Ambulatory Visit (INDEPENDENT_AMBULATORY_CARE_PROVIDER_SITE_OTHER): Payer: 59 | Admitting: Podiatry

## 2022-06-13 DIAGNOSIS — M7752 Other enthesopathy of left foot: Secondary | ICD-10-CM | POA: Diagnosis not present

## 2022-06-13 DIAGNOSIS — M7751 Other enthesopathy of right foot: Secondary | ICD-10-CM | POA: Diagnosis not present

## 2022-06-13 MED ORDER — BETAMETHASONE SOD PHOS & ACET 6 (3-3) MG/ML IJ SUSP
3.0000 mg | Freq: Once | INTRAMUSCULAR | Status: AC
  Administered 2022-06-13: 3 mg via INTRA_ARTICULAR

## 2022-06-13 NOTE — Progress Notes (Signed)
   Chief Complaint  Patient presents with   Foot Pain    Patient is here complaining of bilateral foot pain, she states that the right foot hurts more than the left foot, they are causing knee pain as  well.    HPI: 46 y.o. female presenting today for follow-up evaluation of chronic bilateral lower extremity foot ankle and knee pain.  Patient continues to have intermittent pain and tenderness associated to the bilateral feet especially the right foot.  She is very active throughout work on her feet several hours throughout the day.  She has had cortisone injections in the past which only helped temporarily.  Past Medical History:  Diagnosis Date   Hypertension     Past Surgical History:  Procedure Laterality Date   ABDOMINAL HYSTERECTOMY     TONSILLECTOMY      No Known Allergies   Physical Exam: General: The patient is alert and oriented x3 in no acute distress.  Dermatology: Skin is warm, dry and supple bilateral lower extremities. Negative for open lesions or macerations.  Vascular: Palpable pedal pulses bilaterally. Capillary refill within normal limits.  Negative for any significant edema or erythema  Neurological: Light touch and protective threshold grossly intact  Musculoskeletal Exam: No pedal deformities noted.  There is pain on palpation and range of motion to the second MTP of the right foot  Radiographic Exam RT foot 06/13/2022:  Normal osseous mineralization. Joint spaces preserved. No fracture/dislocation/boney destruction.    Assessment: 1.  Second MTP capsulitis right   Plan of Care:  1. Patient evaluated. X-Rays reviewed.  2.  Injection of 0.5 cc Celestone Soluspan injected in the second TP right 3.  Patient has a prescription for meloxicam 15 mg daily at home.  Continue. 4.  Patient was molded today for custom molded orthotics.  I do believe orthotics will be a good long-term solution to the patient's chronic bilateral foot pain 5.  Return to clinic for  orthotics pickup     Felecia Shelling, DPM [facility]  Dr. Felecia Shelling, DPM    [address] [place], [place] [address]                Office [phone]  Fax [phone]

## 2022-07-09 ENCOUNTER — Ambulatory Visit
Admission: EM | Admit: 2022-07-09 | Discharge: 2022-07-09 | Disposition: A | Discharge status: Home / Self Care | Payer: 59

## 2022-08-07 ENCOUNTER — Other Ambulatory Visit: Payer: 59

## 2022-08-14 ENCOUNTER — Ambulatory Visit
Admission: EM | Admit: 2022-08-14 | Discharge: 2022-08-14 | Disposition: A | Discharge status: Home / Self Care | Payer: 59 | Attending: Emergency Medicine | Admitting: Emergency Medicine

## 2022-08-14 DIAGNOSIS — R350 Frequency of micturition: Secondary | ICD-10-CM

## 2022-08-14 DIAGNOSIS — R319 Hematuria, unspecified: Secondary | ICD-10-CM

## 2022-08-14 LAB — POCT URINALYSIS DIP (MANUAL ENTRY)
Bilirubin, UA: NEGATIVE
Glucose, UA: NEGATIVE mg/dL
Leukocytes, UA: NEGATIVE
Nitrite, UA: NEGATIVE
Spec Grav, UA: >=1.03 — AB (ref 1.010–1.025)
Urobilinogen, UA: 0.2 E.U./dL
pH, UA: 6 (ref 5.0–8.0)

## 2022-08-14 NOTE — ED Provider Notes (Signed)
Roderic Palau    CSN: [id number] Arrival date & time: 08/14/22  1327      History   Chief Complaint Chief Complaint  Patient presents with   Urinary Frequency    HPI Tracy Robertson is a 47 y.o. female.  Patient presents with urinary frequency today.  No fever, chills, dysuria, hematuria, abdominal pain, vaginal discharge, pelvic pain, or other symptoms.  No treatment at home.  Patient was seen at [facility] on 06/24/2022; diagnosed with bacterial vaginosis, dysuria, vaginal discharge; treated with Flagyl.   Her medical history includes hypertension, chronic back pain, DDD, spondylosis of lumbar region, chronic pain syndrome, GERD, asthma, obesity.    The history is provided by the patient and medical records.    Past Medical History:  Diagnosis Date   Hypertension     Patient Active Problem List   Diagnosis Date Noted   Acute focal neurologic deficit with complete resolution 11/09/2021   Dysuria 11/09/2021   Mild intermittent asthma without complication 18/29/9371   Pain medication agreement signed 10/25/2019   Bilateral occipital neuralgia 06/24/2019   Bilateral carpal tunnel syndrome 02/04/2019   Spondylosis of lumbar region without myelopathy or radiculopathy 08/26/2018   Chronic pain syndrome 06/22/2018   Joint pain 06/22/2018   Chronic back pain greater than 3 months duration 05/18/2018   DDD (degenerative disc disease), lumbosacral 09/08/2017   Obesity (BMI 35.0-39.9 without comorbidity) 12/22/2016   Numbness and tingling 11/25/2016   Other muscle spasm 11/25/2016   Benign essential hypertension 01/17/2016   Elevated LDL cholesterol level 01/17/2016   Gastroesophageal reflux disease with hiatal hernia 01/17/2016   Epigastric pain 07/27/2015   Dysmenorrhea 08/18/2011    Past Surgical History:  Procedure Laterality Date   ABDOMINAL HYSTERECTOMY     TONSILLECTOMY      OB History   No obstetric history on file.      Home Medications     Prior to Admission medications   Medication Sig Start Date End Date Taking? Authorizing Provider  albuterol (VENTOLIN HFA) 108 (90 Base) MCG/ACT inhaler Inhale into the lungs. 03/25/19   [provider]  DULoxetine (CYMBALTA) 60 MG capsule Take by mouth. 10/25/19 10/24/20  [provider]  erythromycin ophthalmic ointment Place 1 application. into both eyes 3 (three) times daily. 11/08/21   Blake Divine, MD  fluticasone (FLONASE) 50 MCG/ACT nasal spray Place into the nose. 07/08/19   [provider]  HYDROcodone-acetaminophen (NORCO/VICODIN) 5-325 MG tablet Take 1 tablet by mouth daily as needed.    [provider]  losartan (COZAAR) 25 MG tablet Take 25 mg by mouth daily.    [provider]  meloxicam (MOBIC) 15 MG tablet TAKE 1 TABLET(15 MG) BY MOUTH DAILY 07/22/20   Edrick Kins, DPM  methocarbamol (ROBAXIN) 500 MG tablet Take 1 tablet (500 mg total) by mouth every 8 (eight) hours as needed for muscle spasms. 05/22/22   Ward, Delice Bison, DO  metroNIDAZOLE (FLAGYL) 500 MG tablet Take 500 mg by mouth 2 (two) times daily. 05/18/20   [provider]  ondansetron (ZOFRAN-ODT) 4 MG disintegrating tablet Take 1 tablet (4 mg total) by mouth every 8 (eight) hours as needed for nausea or vomiting. 05/03/22   Duffy Bruce, MD  pantoprazole (PROTONIX) 40 MG tablet Take 40 mg by mouth 2 (two) times daily.    [provider]  propranolol (INDERAL) 10 MG tablet Take by mouth. 11/22/21   [provider]  spironolactone (ALDACTONE) 25 MG tablet Take  25 mg by mouth daily. 03/21/20   [provider]  topiramate (TOPAMAX) 25 MG tablet  12/30/18   [provider]  tretinoin (RETIN-A) 0.05 % cream APPLY NIGHTLY TO SCAP AS DIRECTED 02/13/20   [provider]  triamcinolone ointment (KENALOG) 0.1 % Apply 1 application. topically 2 (two) times daily. 10/26/21   Menshew, Dannielle Karvonen, PA-C    Family History History  reviewed. No pertinent family history.  Social History Social History   Tobacco Use   Smoking status: Never   Smokeless tobacco: Never  Vaping Use   Vaping Use: Every day   Substances: Nicotine  Substance Use Topics   Alcohol use: No   Drug use: No     Allergies   Patient has no known allergies.   Review of Systems Review of Systems  Constitutional:  Negative for chills and fever.  Gastrointestinal:  Negative for abdominal pain, nausea and vomiting.  Genitourinary:  Positive for frequency. Negative for dysuria, flank pain, hematuria, pelvic pain and vaginal discharge.  All other systems reviewed and are negative.    Physical Exam Triage Vital Signs ED Triage Vitals  Enc Vitals Group     BP      Pulse      Resp      Temp      Temp src      SpO2      Weight      Height      Head Circumference      Peak Flow      Pain Score      Pain Loc      Pain Edu?      Excl. in [place]?    No data found.  Updated Vital Signs BP 129/88   Pulse 79   Temp 98 F (36.7 C) (Oral)   Resp 18   SpO2 98%   Visual Acuity Right Eye Distance:   Left Eye Distance:   Bilateral Distance:    Right Eye Near:   Left Eye Near:    Bilateral Near:     Physical Exam Vitals and nursing note reviewed.  Constitutional:      General: She is not in acute distress.    Appearance: Normal appearance. She is well-developed. She is not ill-appearing.  HENT:     Mouth/Throat:     Mouth: Mucous membranes are moist.  Cardiovascular:     Rate and Rhythm: Normal rate and regular rhythm.     Heart sounds: Normal heart sounds.  Pulmonary:     Effort: Pulmonary effort is normal. No respiratory distress.     Breath sounds: Normal breath sounds.  Abdominal:     General: Bowel sounds are normal.     Palpations: Abdomen is soft.     Tenderness: There is no abdominal tenderness. There is no right CVA tenderness, left CVA tenderness, guarding or rebound.  Musculoskeletal:     Cervical back: Neck  supple.  Skin:    General: Skin is warm and dry.  Neurological:     Mental Status: She is alert.  Psychiatric:        Mood and Affect: Mood normal.        Behavior: Behavior normal.      UC Treatments / Results  Labs (all labs ordered are listed, but only abnormal results are displayed) Labs Reviewed  POCT URINALYSIS DIP (MANUAL ENTRY) - Abnormal; Notable for the following components:      Result Value   Color,  UA brown (*)    Clarity, UA hazy (*)    Ketones, POC UA trace (5) (*)    Spec Grav, UA >=1.030 (*)    Blood, UA large (*)    Protein Ur, POC trace (*)    All other components within normal limits    EKG   Radiology No results found.  Procedures Procedures (including critical care time)  Medications Ordered in UC Medications - No data to display  Initial Impression / Assessment and Plan / UC Course  I have reviewed the triage vital signs and the nursing notes.  Pertinent labs & imaging results that were available during my care of the patient were reviewed by me and considered in my medical decision making (see chart for details).    Urinary frequency, hematuria.  Urine negative for infection today.  Discussed increasing water intake.  Instructed patient to have her urine rechecked by her PCP next week for resolution of hematuria.  Education provided on urinary frequency and hematuria.  She agrees to plan of care.   Final Clinical Impressions(s) / UC Diagnoses   Final diagnoses:  Urinary frequency  Hematuria, unspecified type     Discharge Instructions      Your urine does not show signs of infection.  Increase your water intake.  Follow up with your primary care provider for a recheck of your urine.        ED Prescriptions   None    PDMP not reviewed this encounter.   Sharion Balloon, NP 08/14/22 1400

## 2022-08-14 NOTE — ED Triage Notes (Signed)
Patient to Urgent Care with complaints of urinary frequency that started today. Denies any dysuria/ flank pain.  Denies any vaginal discharge.

## 2022-08-14 NOTE — Discharge Instructions (Addendum)
Your urine does not show signs of infection.  Increase your water intake.  Follow up with your primary care provider for a recheck of your urine.

## 2022-08-26 ENCOUNTER — Ambulatory Visit: Payer: 59 | Admitting: Podiatry

## 2022-08-29 ENCOUNTER — Ambulatory Visit (INDEPENDENT_AMBULATORY_CARE_PROVIDER_SITE_OTHER): Payer: 59 | Admitting: Podiatry

## 2022-08-29 DIAGNOSIS — M7751 Other enthesopathy of right foot: Secondary | ICD-10-CM | POA: Diagnosis not present

## 2022-08-29 DIAGNOSIS — M7752 Other enthesopathy of left foot: Secondary | ICD-10-CM | POA: Diagnosis not present

## 2022-08-29 NOTE — Patient Instructions (Signed)
Patient presents today to pick up custom molded foot orthotics recommended by Dr. Amalia Hailey.   Orthotics were dispensed and fit was satisfactory. Reviewed instructions for break-in and wear. Written instructions given to patient.  Patient will follow up as needed.   [employer] [facility]

## 2022-09-01 ENCOUNTER — Other Ambulatory Visit: Payer: Self-pay

## 2022-09-01 ENCOUNTER — Emergency Department
Admission: EM | Admit: 2022-09-01 | Discharge: 2022-09-01 | Disposition: A | Discharge status: Home / Self Care | Payer: 59 | Attending: Emergency Medicine | Admitting: Emergency Medicine

## 2022-09-01 DIAGNOSIS — G43819 Other migraine, intractable, without status migrainosus: Secondary | ICD-10-CM | POA: Insufficient documentation

## 2022-09-01 DIAGNOSIS — R42 Dizziness and giddiness: Secondary | ICD-10-CM | POA: Diagnosis not present

## 2022-09-01 LAB — CBC WITH DIFFERENTIAL/PLATELET
Abs Immature Granulocytes: 0.01 10*3/uL (ref 0.00–0.07)
Basophils Absolute: 0 10*3/uL (ref 0.0–0.1)
Basophils Relative: 1 %
Eosinophils Absolute: 0.2 10*3/uL (ref 0.0–0.5)
Eosinophils Relative: 3 %
HCT: 39.9 % (ref 36.0–46.0)
Hemoglobin: 12.9 g/dL (ref 12.0–15.0)
Immature Granulocytes: 0 %
Lymphocytes Relative: 43 %
Lymphs Abs: 2.9 10*3/uL (ref 0.7–4.0)
MCH: 29.8 pg (ref 26.0–34.0)
MCHC: 32.3 g/dL (ref 30.0–36.0)
MCV: 92.1 fL (ref 80.0–100.0)
Monocytes Absolute: 0.7 10*3/uL (ref 0.1–1.0)
Monocytes Relative: 10 %
Neutro Abs: 3 10*3/uL (ref 1.7–7.7)
Neutrophils Relative %: 43 %
Platelets: 248 10*3/uL (ref 150–400)
RBC: 4.33 MIL/uL (ref 3.87–5.11)
RDW: 12.9 % (ref 11.5–15.5)
WBC: 6.8 10*3/uL (ref 4.0–10.5)
nRBC: 0 % (ref 0.0–0.2)

## 2022-09-01 LAB — COMPREHENSIVE METABOLIC PANEL
ALT: 16 U/L (ref 0–44)
AST: 15 U/L (ref 15–41)
Albumin: 3.7 g/dL (ref 3.5–5.0)
Alkaline Phosphatase: 45 U/L (ref 38–126)
Anion gap: 8 (ref 5–15)
BUN: 14 mg/dL (ref 6–20)
CO2: 27 mmol/L (ref 22–32)
Calcium: 8.8 mg/dL — ABNORMAL LOW (ref 8.9–10.3)
Chloride: 104 mmol/L (ref 98–111)
Creatinine, Ser: 0.86 mg/dL (ref 0.44–1.00)
GFR, Estimated: >60 mL/min (ref >60)
Glucose, Bld: 89 mg/dL (ref 70–99)
Potassium: 3.7 mmol/L (ref 3.5–5.1)
Sodium: 139 mmol/L (ref 135–145)
Total Bilirubin: 0.4 mg/dL (ref 0.3–1.2)
Total Protein: 6.7 g/dL (ref 6.5–8.1)

## 2022-09-01 LAB — TROPONIN I (HIGH SENSITIVITY): Troponin I (High Sensitivity): <2 ng/L (ref <18)

## 2022-09-01 LAB — URINALYSIS, W/ REFLEX TO CULTURE (INFECTION SUSPECTED)
Bacteria, UA: NONE SEEN
Bilirubin Urine: NEGATIVE
Glucose, UA: NEGATIVE mg/dL
Hgb urine dipstick: NEGATIVE
Ketones, ur: NEGATIVE mg/dL
Leukocytes,Ua: NEGATIVE
Nitrite: NEGATIVE
Protein, ur: NEGATIVE mg/dL
Specific Gravity, Urine: 1.018 (ref 1.005–1.030)
pH: 6 (ref 5.0–8.0)

## 2022-09-01 MED ORDER — SODIUM CHLORIDE 0.9 % IV BOLUS
1000.0000 mL | Freq: Once | INTRAVENOUS | Status: AC
  Administered 2022-09-01: 1000 mL via INTRAVENOUS

## 2022-09-01 MED ORDER — ACETAMINOPHEN 500 MG PO TABS
1000.0000 mg | ORAL_TABLET | Freq: Once | ORAL | Status: AC
  Administered 2022-09-01: 1000 mg via ORAL
  Filled 2022-09-01: qty 2

## 2022-09-01 MED ORDER — MECLIZINE HCL 25 MG PO TABS: 25.0000 mg | ORAL_TABLET | Freq: Three times a day (TID) | ORAL | 0 refills | Status: DC | PRN

## 2022-09-01 MED ORDER — DIPHENHYDRAMINE HCL 50 MG/ML IJ SOLN
12.5000 mg | Freq: Once | INTRAMUSCULAR | Status: AC
  Administered 2022-09-01: 12.5 mg via INTRAVENOUS
  Filled 2022-09-01: qty 1

## 2022-09-01 MED ORDER — METOCLOPRAMIDE HCL 5 MG/ML IJ SOLN
10.0000 mg | Freq: Once | INTRAMUSCULAR | Status: AC
  Administered 2022-09-01: 10 mg via INTRAVENOUS
  Filled 2022-09-01: qty 2

## 2022-09-01 NOTE — Discharge Instructions (Addendum)
You can take Tylenol 1 g every 8 hours and ibuprofen 600 every 8 hours with food.  You can trial the meclizine to see if it helps with any of the dizziness in case this could be vertigo.  If you feel that the meclizine is helping and the vertigo symptoms are continuing you can call ENT to follow-up with them outpatient.  If you continue to have migraines she can follow-up with a neurologist to discuss migraine preventative medications.Return ot ER if you develop dizziness at rest, fevers, worsening severe headache or any other concerns

## 2022-09-01 NOTE — ED Triage Notes (Signed)
Patient c/o pressure to right side of head and dizziness intermittently starting this morning. States is not currently dizzy. Denies any weakness or blurred vision.

## 2022-09-01 NOTE — ED Provider Notes (Signed)
[facility] Provider Note    Event Date/Time   First MD Initiated Contact with Patient 09/01/22 1501     (approximate)   History   Dizziness (Patient c/o pressure to right side of head and dizziness intermittently starting this morning. States is not currently dizzy. Denies any weakness or blurred vision. )   HPI  Tracy Robertson is a 47 y.o. female  otherwise healthy who comes in with sided head pain.  Patient reports having history of migraines.  She reports having a migraine with left-sided weakness on 11/09/2021 where she was admitted with negative CTA, MRI thought to be unlikely a TIA due to no risk factors.  Reports that she sees an ophthalmology next regularly has never had issues with glaucoma or any other significant eye issues.   She reports that she woke up this morning around 5:30 AM feeling a little bit more tired than normal but then around 7 she noted a little bit more dizziness and then around noon she noted a headache.  She reports this headache is similar to her normal headaches but the dizziness is new.  Denies it being sudden or severe in onset.  The headache is more behind the right side of her head.  She denies any dizziness at this time.  However if she has to to sit up and move she reports a little bit of lightheadedness and may be some mild dizziness associated with it.  She has no symptoms at rest.  She denies any vision changes any trauma to the neck any chiropractic to the neck any birth control or estrogen pills.  Patient does report some increased urinary frequency.     Physical Exam   Triage Vital Signs: ED Triage Vitals  Enc Vitals Group     BP 09/01/22 1211 (!) 131/99     Pulse Rate 09/01/22 1211 77     Resp 09/01/22 1211 18     Temp 09/01/22 1211 97.8 F (36.6 C)     Temp Source 09/01/22 1211 Oral     SpO2 09/01/22 1211 100 %     Weight 09/01/22 1212 195 lb (88.5 kg)     Height 09/01/22 1212 5' 3"$  (1.6 m)     Head  Circumference --      Peak Flow --      Pain Score 09/01/22 1211 6     Pain Loc --      Pain Edu? --      Excl. in [place]? --     Most recent vital signs: Vitals:   09/01/22 1211  BP: (!) 131/99  Pulse: 77  Resp: 18  Temp: 97.8 F (36.6 C)  SpO2: 100%     General: Awake, no distress.  CV:  Good peripheral perfusion.  Resp:  Normal effort.  Abd:  No distention.  Soft and nontender Other:  TMs are clear bilaterally.  Cranial nerves II through XII are intact.  Equal strength in arms and legs.  Finger-nose intact bilaterally.  Heel-to-shin normal bilaterally.  Romberg negative.  Patient ambulatory without any ataxia. Pupils reactive bilaterally.    ED Results / Procedures / Treatments   Labs (all labs ordered are listed, but only abnormal results are displayed) Labs Reviewed  COMPREHENSIVE METABOLIC PANEL - Abnormal; Notable for the following components:      Result Value   Calcium 8.8 (*)    All other components within normal limits  CBC WITH DIFFERENTIAL/PLATELET  URINALYSIS, W/ REFLEX TO  CULTURE (INFECTION SUSPECTED)  TROPONIN I (HIGH SENSITIVITY)     EKG  My interpretation of EKG:  Normal sinus rate, no st elevation, no twi,normal intervals    PROCEDURES:  Critical Care performed: No  .1-3 Lead EKG Interpretation  Performed by: Vanessa [place], MD Authorized by: Vanessa [place], MD     Interpretation: normal     ECG rate:  70   ECG rate assessment: normal     Rhythm: sinus rhythm     Ectopy: none     Conduction: normal      MEDICATIONS ORDERED IN ED: Medications  sodium chloride 0.9 % bolus 1,000 mL (1,000 mLs Intravenous New Bag/Given 09/01/22 1526)  metoCLOPramide (REGLAN) injection 10 mg (10 mg Intravenous Given 09/01/22 1525)  diphenhydrAMINE (BENADRYL) injection 12.5 mg (12.5 mg Intravenous Given 09/01/22 1525)  acetaminophen (TYLENOL) tablet 1,000 mg (1,000 mg Oral Given 09/01/22 1525)     IMPRESSION / MDM / ASSESSMENT AND PLAN / ED COURSE  I  reviewed the triage vital signs and the nursing notes.   Patient's presentation is most consistent with acute presentation with potential threat to life or bodily function.   Differential includes Electra eminence, AKI, migraine, complex migraine, vertigo.  Stroke code not called given out of the window and symptoms do not seem consistent with that neuroexam normal without any evidence of cerebellar involvement.  She has no symptoms at rest.  Do not feel like imaging is necessary at this time.  W she is already had CTAs without evidence of any aneurysm or any clot burden and had MRIs without any evidence of mass.  We discussed CT imaging if we are worried about any kind of intracranial hemorrhage but she is not on a blood thinner no significantly elevated blood pressure and no falls and hitting her head and at this time she would like to hold off   CBC reassuring no anemia CMP reassuring troponin is negative symptoms have been going on for greater than 3 hours. UA negative  4:21 PM reevaluated patient after medications.  Patient is feeling much better.  Patient is ambulatory to the bathroom without any significant symptoms.  She feels comfortable with discharge home.  We did rediscuss CT imaging but given previous recent workup with MRI CTAs and patient's history of migraines patient would like to hold off on CT head..  We discussed trialing some meclizine for possible vertigo, taking Tylenol ibuprofen for headache stay well-hydrated and return to the ER if she developing worsening symptoms.  We discussed stroke symptoms to be aware of and she expressed understanding for return precuations.   The patient is on the cardiac monitor to evaluate for evidence of arrhythmia and/or significant heart rate changes.      FINAL CLINICAL IMPRESSION(S) / ED DIAGNOSES   Final diagnoses:  Vertigo  Other migraine without status migrainosus, intractable     Rx / DC Orders   ED Discharge Orders           Ordered    meclizine (ANTIVERT) 25 MG tablet  3 times daily PRN        09/01/22 1623             Note:  This document was prepared using Dragon voice recognition software and may include unintentional dictation errors.   Vanessa [place], MD 09/01/22 [phone]

## 2022-09-03 ENCOUNTER — Ambulatory Visit (INDEPENDENT_AMBULATORY_CARE_PROVIDER_SITE_OTHER): Payer: 59 | Admitting: Urology

## 2022-09-03 ENCOUNTER — Encounter: Payer: Self-pay | Admitting: Urology

## 2022-09-03 VITALS — BP 129/86 | HR 83 | Ht 63.0 in | Wt 197.0 lb

## 2022-09-03 DIAGNOSIS — R300 Dysuria: Secondary | ICD-10-CM | POA: Diagnosis not present

## 2022-09-03 DIAGNOSIS — R3915 Urgency of urination: Secondary | ICD-10-CM

## 2022-09-03 DIAGNOSIS — N3281 Overactive bladder: Secondary | ICD-10-CM

## 2022-09-03 DIAGNOSIS — R350 Frequency of micturition: Secondary | ICD-10-CM

## 2022-09-03 DIAGNOSIS — N941 Unspecified dyspareunia: Secondary | ICD-10-CM | POA: Diagnosis not present

## 2022-09-03 DIAGNOSIS — N390 Urinary tract infection, site not specified: Secondary | ICD-10-CM

## 2022-09-03 DIAGNOSIS — N958 Other specified menopausal and perimenopausal disorders: Secondary | ICD-10-CM

## 2022-09-03 MED ORDER — ESTRADIOL 0.1 MG/GM VA CREA: TOPICAL_CREAM | VAGINAL | 12 refills | Status: AC

## 2022-09-03 NOTE — Progress Notes (Signed)
   09/03/22 2:24 PM   Tracy Robertson 09/18/1975 [id number]  CC: Dysuria, urinary symptoms, dyspareunia  HPI: 47 year old female referred for about a month of intermittent dysuria and urinary urgency/frequency.  Urinalysis 09/01/2022 was completely benign.  She also had a CT of the abdomen and pelvis with contrast in April 2023 which was benign from a urologic perspective.  She drinks primarily water during the day.  She also has pain with sexual activity.  She denies any gross hematuria.  Recent urine culture and STD testing also negative.  PMH: Past Medical History:  Diagnosis Date   Hypertension     Surgical History: Past Surgical History:  Procedure Laterality Date   ABDOMINAL HYSTERECTOMY     TONSILLECTOMY       Family History: No family history on file.  Social History:  reports that she has never smoked. She has never used smokeless tobacco. She reports that she does not drink alcohol and does not use drugs.  Physical Exam: BP 129/86 (BP Location: Left Arm, Patient Position: Sitting, Cuff Size: Large)   Pulse 83   Ht 5' 3"$  (1.6 m)   Wt 197 lb (89.4 kg)   SpO2 98%   BMI 34.90 kg/m    Constitutional:  Alert and oriented, No acute distress. Cardiovascular: No clubbing, cyanosis, or edema. Respiratory: Normal respiratory effort, no increased work of breathing. GI: Abdomen is soft, nontender, nondistended, no abdominal masses    Pertinent Imaging: I have personally viewed and interpreted the CT scan from April 2023 showing no urologic abnormalities.  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  We discussed the overlap between OAB and GSM.  I recommended starting with a trial of topical estrogen cream, and we could consider adding an OAB medication in the future if she is having persistent urinary symptoms.  Will start with behavioral strategies and  avoiding bladder irritants.  Trial of topical estrogen cream, RTC 8 weeks, if persistent bladder symptoms consider OAB medication at that time   Nickolas Madrid, MD 09/03/2022  [facility] [address] [place], [place] [address] [phone]

## 2022-09-03 NOTE — Patient Instructions (Signed)
Genitourinary Syndrome of Menopause Genitourinary syndrome of menopause is a chronic, progressive condition of the vulva, vagina, and lower urinary tract which is characterized by the following signs/symptoms:  Vaginal Symptoms Vaginal dryness Vaginal irritation/burning/itching Thinning/graying pubic hair Vaginal pelvic pain/pressure Sexual Symptoms Painful sexual intercourse Decreased lubrication during sexual activity Bleeding after sexual activity Decreased arousal/loss of libido Inability to orgasm Urinary Symptoms Painful urination Urinary urgency Urinary incontinence (stress and urge) Recurrent urinary infections Urethral caruncle (red vascular growth on urethra) How is Genitourinary syndrome of menopause (GSM) different than Overactive Bladder (OAB)? Symptoms can overlap in both conditions, but women with OAB tend to experience more urinary incontinence symptoms. Women with GSM tend to experience both vaginal and urinary symptoms together.  What is the difference between Genitourinary syndrome of menopause (GSM) and urinary tract infection (UTI)? Both GSM and UTI's can cause painful urination (dysuria). A UTI is diagnosed via urinary testing that shows inflammation and infection due to abnormal bacterial growth in the urinary tract. GSM tends to cause painful urination when the urine touches the thin vaginal tissue resulting in a burning sensation.  How can we treat GSM? NON-HORMONAL THERAPY Lifestyle changes (maintenance of sexual activity, smoking cessation, avoidance of vulvovaginal irritants including personal hygiene products in the vulvovaginal area) Nonhormonal vaginal lubricants or moisturizers Mucosal lidocaine (Examples of over-the-counter nonhormonal vaginal moisturizers include Replens, Refresh, Good Clean Love, and Luvena)  HORMONAL THERAPY (CONSIDERED THE GOLD-STANDARD OF TREATMENT) Vaginal estrogen therapy (cream, tablet or ring) Estrogen: A type of hormone  that promotes and maintains female traits in the body. Also referred to as a female sex hormone.  How does vaginal estrogen therapy work? Vaginal estrogen is the most effective treatment for GSM. It improves the quality of the vaginal skin and tissues in and around the vagina. It thickens the skin of the vaginal canal and increases natural lubrication. It also restores the normal pH of the vagina. It has been shown to reduce the risk of UTI in many studies.  Is vaginal estrogen therapy safe? Overall, the answer is yes! Local vaginal hormonal therapy is associated with minimal to no side effects/risks when used as prescribed as very little is absorbed into the blood stream.  Before prescribing, we will ask you if you have a history of breast cancer or a history of blood clots in the legs or lungs. If you have a history of either of these problems, we may consult with your Oncologist or Vascular specialist before we prescribe vaginal estrogen. In most cases, these specialists are ok with vaginal estrogen use because the systemic absorption (absorption into the blood stream) is so low.  Studies have shown that vaginal estrogen use does NOT increase the risk of cardiovascular disease or cancer.  Does vaginal estrogen interfere with sexual activity? It is advised to wait 12 hours after inserting vaginal estrogen tablet or cream to have sex, although the risk to your partner is not thought to be of concern.

## 2022-09-09 NOTE — Progress Notes (Signed)
Orthotics were picked up and they are functioning well.  If any foot and ankle issues on future she will come back and see me.

## 2022-10-31 ENCOUNTER — Other Ambulatory Visit: Payer: Self-pay

## 2022-10-31 DIAGNOSIS — N390 Urinary tract infection, site not specified: Secondary | ICD-10-CM

## 2022-11-02 NOTE — Progress Notes (Deleted)
11/03/2022 10:13 AM   Annie Paras 1976-06-04 [id number]  Referring provider: [facility] [address] [place],  [place] [address]  Urological history: 1. GSM -Vaginal estrogen cream  2. OAB -Contributing factors of age, GSM, vaginal atrophy, HTN, asthma, lumbar DDD, obesity, diuretics and pelvic surgery  No chief complaint on file.   HPI: Tracy Robertson is a 47 y.o. female who presents today for follow up after a trial of vaginal estrogen cream.    Previous records reviewed.     PMH: Past Medical History:  Diagnosis Date   Hypertension     Surgical History: Past Surgical History:  Procedure Laterality Date   ABDOMINAL HYSTERECTOMY     TONSILLECTOMY      Home Medications:  Allergies as of 11/03/2022   No Known Allergies      Medication List        Accurate as of November 02, 2022 10:13 AM. If you have any questions, ask your nurse or doctor.          estradiol 0.1 MG/GM vaginal cream Commonly known as: ESTRACE Estrogen Cream Instruction Discard applicator Apply pea sized amount to tip of finger to urethra before bed. Wash hands well after application. Use Monday, Wednesday and Friday   fluticasone 50 MCG/ACT nasal spray Commonly known as: FLONASE Place into the nose.   HYDROcodone-acetaminophen 5-325 MG tablet Commonly known as: NORCO/VICODIN Take 1 tablet by mouth daily as needed.   losartan 25 MG tablet Commonly known as: COZAAR Take 25 mg by mouth daily.   pantoprazole 40 MG tablet Commonly known as: PROTONIX Take 40 mg by mouth 2 (two) times daily.   propranolol 10 MG tablet Commonly known as: INDERAL Take by mouth.   spironolactone 25 MG tablet Commonly known as: ALDACTONE Take 25 mg by mouth daily.   Ventolin HFA 108 (90 Base) MCG/ACT inhaler Generic drug: albuterol Inhale into the lungs.        Allergies: No Known Allergies  Family History: No family history on file.  Social History:  reports  that she has never smoked. She has never used smokeless tobacco. She reports that she does not drink alcohol and does not use drugs.  ROS: Pertinent ROS in HPI  Physical Exam: There were no vitals taken for this visit.  Constitutional:  Well nourished. Alert and oriented, No acute distress. HEENT: [place] AT, moist mucus membranes.  Trachea midline, no masses. Cardiovascular: No clubbing, cyanosis, or edema. Respiratory: Normal respiratory effort, no increased work of breathing. GI: Abdomen is soft, non tender, non distended, no abdominal masses. Liver and spleen not palpable.  No hernias appreciated.  Stool sample for occult testing is not indicated.   GU: No CVA tenderness.  No bladder fullness or masses.  Patient with circumcised/uncircumcised phallus. ***Foreskin easily retracted***  Urethral meatus is patent.  No penile discharge. No penile lesions or rashes. Scrotum without lesions, cysts, rashes and/or edema.  Testicles are located scrotally bilaterally. No masses are appreciated in the testicles. Left and right epididymis are normal. Rectal: Patient with  normal sphincter tone. Anus and perineum without scarring or rashes. No rectal masses are appreciated. Prostate is approximately *** grams, *** nodules are appreciated. Seminal vesicles are normal. Skin: No rashes, bruises or suspicious lesions. Lymph: No cervical or inguinal adenopathy. Neurologic: Grossly intact, no focal deficits, moving all 4 extremities. Psychiatric: Normal mood and affect.  Laboratory Data: Serum creatinine 0.8 on October 30, 2022, e GFR 93 Hemoglobin A1c  5.17 October 2022  Urinalysis ***  I have reviewed the labs.   Pertinent Imaging: ***   Assessment & Plan:  ***  1. GSM ***  2. OAB ***  No follow-ups on file.  These notes generated with voice recognition software. I apologize for typographical errors.  Cloretta Ned  [facility] [address]  [address] [place], [place] [address] [phone]

## 2022-11-03 ENCOUNTER — Ambulatory Visit: Payer: 59 | Admitting: Urology

## 2022-12-27 ENCOUNTER — Other Ambulatory Visit: Payer: Self-pay

## 2022-12-27 ENCOUNTER — Emergency Department
Admission: EM | Admit: 2022-12-27 | Discharge: 2022-12-27 | Disposition: A | Discharge status: Home / Self Care | Payer: 59 | Attending: Emergency Medicine | Admitting: Emergency Medicine

## 2022-12-27 DIAGNOSIS — R112 Nausea with vomiting, unspecified: Secondary | ICD-10-CM | POA: Diagnosis not present

## 2022-12-27 DIAGNOSIS — R197 Diarrhea, unspecified: Secondary | ICD-10-CM | POA: Insufficient documentation

## 2022-12-27 DIAGNOSIS — R1012 Left upper quadrant pain: Secondary | ICD-10-CM | POA: Diagnosis not present

## 2022-12-27 DIAGNOSIS — I10 Essential (primary) hypertension: Secondary | ICD-10-CM | POA: Insufficient documentation

## 2022-12-27 DIAGNOSIS — R1013 Epigastric pain: Secondary | ICD-10-CM | POA: Insufficient documentation

## 2022-12-27 LAB — COMPREHENSIVE METABOLIC PANEL
ALT: 12 U/L (ref 0–44)
AST: 15 U/L (ref 15–41)
Albumin: 4.2 g/dL (ref 3.5–5.0)
Alkaline Phosphatase: 54 U/L (ref 38–126)
Anion gap: 12 (ref 5–15)
BUN: 13 mg/dL (ref 6–20)
CO2: 21 mmol/L — ABNORMAL LOW (ref 22–32)
Calcium: 8.9 mg/dL (ref 8.9–10.3)
Chloride: 103 mmol/L (ref 98–111)
Creatinine, Ser: 0.9 mg/dL (ref 0.44–1.00)
GFR, Estimated: >60 mL/min (ref >60)
Glucose, Bld: 95 mg/dL (ref 70–99)
Potassium: 3.9 mmol/L (ref 3.5–5.1)
Sodium: 136 mmol/L (ref 135–145)
Total Bilirubin: 1 mg/dL (ref 0.3–1.2)
Total Protein: 7.4 g/dL (ref 6.5–8.1)

## 2022-12-27 LAB — URINALYSIS, ROUTINE W REFLEX MICROSCOPIC
Bilirubin Urine: NEGATIVE
Glucose, UA: NEGATIVE mg/dL
Hgb urine dipstick: NEGATIVE
Ketones, ur: NEGATIVE mg/dL
Leukocytes,Ua: NEGATIVE
Nitrite: NEGATIVE
Protein, ur: NEGATIVE mg/dL
Specific Gravity, Urine: 1.028 (ref 1.005–1.030)
pH: 5 (ref 5.0–8.0)

## 2022-12-27 LAB — CBC
HCT: 44.5 % (ref 36.0–46.0)
Hemoglobin: 14.3 g/dL (ref 12.0–15.0)
MCH: 29.6 pg (ref 26.0–34.0)
MCHC: 32.1 g/dL (ref 30.0–36.0)
MCV: 92.1 fL (ref 80.0–100.0)
Platelets: 260 10*3/uL (ref 150–400)
RBC: 4.83 MIL/uL (ref 3.87–5.11)
RDW: 13.4 % (ref 11.5–15.5)
WBC: 9.4 10*3/uL (ref 4.0–10.5)
nRBC: 0 % (ref 0.0–0.2)

## 2022-12-27 LAB — LIPASE, BLOOD: Lipase: 36 U/L (ref 11–51)

## 2022-12-27 MED ORDER — FAMOTIDINE IN NACL 20-0.9 MG/50ML-% IV SOLN
20.0000 mg | Freq: Once | INTRAVENOUS | Status: AC
  Administered 2022-12-27: 20 mg via INTRAVENOUS
  Filled 2022-12-27: qty 50

## 2022-12-27 MED ORDER — ONDANSETRON HCL 4 MG/2ML IJ SOLN
4.0000 mg | Freq: Once | INTRAMUSCULAR | Status: AC
  Administered 2022-12-27: 4 mg via INTRAVENOUS
  Filled 2022-12-27: qty 2

## 2022-12-27 MED ORDER — MORPHINE SULFATE (PF) 4 MG/ML IV SOLN: 4.0000 mg | Freq: Once | INTRAVENOUS | Status: DC

## 2022-12-27 MED ORDER — ALUM & MAG HYDROXIDE-SIMETH 200-200-20 MG/5ML PO SUSP
30.0000 mL | Freq: Once | ORAL | Status: AC
  Administered 2022-12-27: 30 mL via ORAL
  Filled 2022-12-27: qty 30

## 2022-12-27 MED ORDER — ONDANSETRON 4 MG PO TBDP: 4.0000 mg | ORAL_TABLET | Freq: Three times a day (TID) | ORAL | 0 refills | Status: AC | PRN

## 2022-12-27 MED ORDER — KETOROLAC TROMETHAMINE 30 MG/ML IJ SOLN: 15.0000 mg | Freq: Once | INTRAMUSCULAR | Status: DC

## 2022-12-27 MED ORDER — LACTATED RINGERS IV BOLUS
1000.0000 mL | Freq: Once | INTRAVENOUS | Status: AC
  Administered 2022-12-27: 1000 mL via INTRAVENOUS

## 2022-12-27 NOTE — ED Provider Notes (Signed)
[facility] Provider Note    Event Date/Time   First MD Initiated Contact with Patient 12/27/22 1054     (approximate)   History   Emesis   HPI  Tracy Robertson is a 47 y.o. female with past medical history of hypertension who presents with abdominal pain, nausea vomiting and diarrhea.  Symptoms started 2 nights ago.  She endorses epigastric and left upper quadrant pain that is cramping in nature.  Comes and goes.  Started having diarrhea yesterday has had multiple episodes of nonbloody diarrhea.  Has had decreased appetite.  Then overnight started having vomiting has had 2 episodes total.  Denies urinary symptoms fevers chills no abnormal vaginal discharge.  She has had a hysterectomy and thinks her appendix has been removed.  No sick contacts.  She did take Imodium which seemed to help with the diarrhea.     Past Medical History:  Diagnosis Date   Hypertension     Patient Active Problem List   Diagnosis Date Noted   Acute focal neurologic deficit with complete resolution 11/09/2021   Dysuria 11/09/2021   Mild intermittent asthma without complication 05/15/2020   Pain medication agreement signed 10/25/2019   Bilateral occipital neuralgia 06/24/2019   Bilateral carpal tunnel syndrome 02/04/2019   Spondylosis of lumbar region without myelopathy or radiculopathy 08/26/2018   Chronic pain syndrome 06/22/2018   Joint pain 06/22/2018   Chronic back pain greater than 3 months duration 05/18/2018   DDD (degenerative disc disease), lumbosacral 09/08/2017   Obesity (BMI 35.0-39.9 without comorbidity) 12/22/2016   Numbness and tingling 11/25/2016   Other muscle spasm 11/25/2016   Benign essential hypertension 01/17/2016   Elevated LDL cholesterol level 01/17/2016   Gastroesophageal reflux disease with hiatal hernia 01/17/2016   Epigastric pain 07/27/2015   Dysmenorrhea 08/18/2011     Physical Exam  Triage Vital Signs: ED Triage Vitals [12/27/22  0956]  Enc Vitals Group     BP (!) 151/94     Pulse Rate 85     Resp 20     Temp 98.2 F (36.8 C)     Temp Source Oral     SpO2 100 %     Weight 175 lb (79.4 kg)     Height 5\' 3"  (1.6 m)     Head Circumference      Peak Flow      Pain Score 8     Pain Loc      Pain Edu?      Excl. in [place]?     Most recent vital signs: Vitals:   12/27/22 0956  BP: (!) 151/94  Pulse: 85  Resp: 20  Temp: 98.2 F (36.8 C)  SpO2: 100%     General: Awake, no distress.  CV:  Good peripheral perfusion.  Resp:  Normal effort.  Abd:  No distention.  No significant lower quadrant tenderness, mild tenderness in the epigastric region and left upper quadrant but no guarding abdomen soft Neuro:             Awake, Alert, Oriented x 3  Other:     ED Results / Procedures / Treatments  Labs (all labs ordered are listed, but only abnormal results are displayed) Labs Reviewed  COMPREHENSIVE METABOLIC PANEL - Abnormal; Notable for the following components:      Result Value   CO2 21 (*)    All other components within normal limits  URINALYSIS, ROUTINE W REFLEX MICROSCOPIC - Abnormal; Notable for the following  components:   Color, Urine YELLOW (*)    APPearance CLOUDY (*)    All other components within normal limits  LIPASE, BLOOD  CBC     EKG     RADIOLOGY    PROCEDURES:  Critical Care performed: No  Procedures    MEDICATIONS ORDERED IN ED: Medications  lactated ringers bolus 1,000 mL (1,000 mLs Intravenous New Bag/Given 12/27/22 1158)  ondansetron (ZOFRAN) injection 4 mg (4 mg Intravenous Given 12/27/22 1158)  famotidine (PEPCID) IVPB 20 mg premix (0 mg Intravenous Stopped 12/27/22 1252)  alum & mag hydroxide-simeth (MAALOX/MYLANTA) 200-200-20 MG/5ML suspension 30 mL (30 mLs Oral Given 12/27/22 1254)     IMPRESSION / MDM / ASSESSMENT AND PLAN / ED COURSE  I reviewed the triage vital signs and the nursing notes.                              Patient's presentation is most  consistent with acute complicated illness / injury requiring diagnostic workup.  Differential diagnosis includes, but is not limited to, viral gastroenteritis, colitis, diverticulitis, gastritis, pancreatitis Patient is a 47 year old female presents with upper abdominal pain nausea vomiting and diarrhea.  Abdominal pain started first followed by diarrhea and now nausea and vomiting.  She is not having fevers and is tolerating p.o.  Pain is primarily left upper quadrant and is cramping in nature.  Does have history of prior hysterectomy.  Patient is hypertensive but vitals otherwise reassuring.  On exam she looks well nontoxic.  Abdominal exam is overall benign.  She is tender in the epigastric region and left upper quadrants but there is no lower quadrant tenderness and there is no guarding and abdomen is soft.  Are reassuring she has no leukocytosis normal LFTs and lipase.  I am most suspicious for a gastroenteritis.  Will start with a bolus of fluids Zofran Pepcid.  Do not feel that patient needs cross-sectional imaging at this time but will reassess and repeat abdominal exam.   Patient still having some discomfort after Pepcid but nausea has improved so given GI cocktail.  On repeat abdominal exam she has mild tenderness in the left upper quadrant epigastric region but no lower quadrant tenderness.  I suspect that this is likely a gastroenteritis.  Given she is tolerating p.o. and feeling improved I do think she is appropriate for discharge.  I did discuss return precautions with her particularly pain that is worsening or migrating to the right or left lower quadrant or inability to tolerate p.o.  Recommended OTC Pepcid twice daily and will prescribe Zofran.       FINAL CLINICAL IMPRESSION(S) / ED DIAGNOSES   Final diagnoses:  Left upper quadrant abdominal pain  Nausea vomiting and diarrhea     Rx / DC Orders   ED Discharge Orders          Ordered    ondansetron (ZOFRAN-ODT) 4 MG  disintegrating tablet  Every 8 hours PRN        12/27/22 1343             Note:  This document was prepared using Dragon voice recognition software and may include unintentional dictation errors.   Georga Hacking, MD 12/27/22 1343

## 2022-12-27 NOTE — ED Triage Notes (Signed)
Patient complaining of upper abdominal pain, N/V/D since yesterday.

## 2022-12-27 NOTE — Discharge Instructions (Signed)
I suspect that you have gastroenteritis.  You can stick to clear liquids until you are feeling improved.  I recommend taking Pepcid 20 mg twice a day for to help with your abdominal pain.  Take the Zofran as needed for nausea and vomiting.  If your abdominal pain is worsening, particularly if becomes more pronounced in the left or right lower part of your abdomen then please return to the emergency department.

## 2023-01-02 ENCOUNTER — Ambulatory Visit
Admission: EM | Admit: 2023-01-02 | Discharge: 2023-01-02 | Disposition: A | Discharge status: Home / Self Care | Payer: 59 | Attending: Urgent Care | Admitting: Urgent Care

## 2023-01-02 DIAGNOSIS — R300 Dysuria: Secondary | ICD-10-CM | POA: Diagnosis not present

## 2023-01-02 DIAGNOSIS — N3001 Acute cystitis with hematuria: Secondary | ICD-10-CM | POA: Diagnosis not present

## 2023-01-02 LAB — POCT URINALYSIS DIP (MANUAL ENTRY)
Bilirubin, UA: NEGATIVE
Glucose, UA: NEGATIVE mg/dL
Ketones, POC UA: NEGATIVE mg/dL
Nitrite, UA: POSITIVE — AB
Protein Ur, POC: 100 mg/dL — AB
Spec Grav, UA: 1.025 (ref 1.010–1.025)
Urobilinogen, UA: 0.2 E.U./dL
pH, UA: 5.5 (ref 5.0–8.0)

## 2023-01-02 MED ORDER — CEPHALEXIN 500 MG PO CAPS
500.0000 mg | ORAL_CAPSULE | Freq: Four times a day (QID) | ORAL | 0 refills | Status: AC
Start: 2023-01-02 — End: 2023-01-07

## 2023-01-02 NOTE — ED Triage Notes (Signed)
Here for UTI-symptoms x2 days.

## 2023-01-02 NOTE — Discharge Instructions (Signed)
Follow up here or with your primary care provider if your symptoms are worsening or not improving with treatment.     

## 2023-01-02 NOTE — ED Provider Notes (Signed)
Renaldo Fiddler    CSN: [id number] Arrival date & time: 01/02/23  1427      History   Chief Complaint Chief Complaint  Patient presents with   Dysuria    HPI Tracy Robertson is a 47 y.o. female.    Dysuria   Presents with complaint of 2-day history of UTI symptoms including dysuria.  PMH includes recurrent UTI per chart.  She is followed by urology for genitourinary symptoms with mostly benign workup and given a trial of topical estrogen cream with plan to return to clinic in 8 weeks.   Past Medical History:  Diagnosis Date   Hypertension     Patient Active Problem List   Diagnosis Date Noted   Acute focal neurologic deficit with complete resolution 11/09/2021   Dysuria 11/09/2021   Mild intermittent asthma without complication 05/15/2020   Pain medication agreement signed 10/25/2019   Bilateral occipital neuralgia 06/24/2019   Bilateral carpal tunnel syndrome 02/04/2019   Spondylosis of lumbar region without myelopathy or radiculopathy 08/26/2018   Chronic pain syndrome 06/22/2018   Joint pain 06/22/2018   Chronic back pain greater than 3 months duration 05/18/2018   DDD (degenerative disc disease), lumbosacral 09/08/2017   Obesity (BMI 35.0-39.9 without comorbidity) 12/22/2016   Numbness and tingling 11/25/2016   Other muscle spasm 11/25/2016   Benign essential hypertension 01/17/2016   Elevated LDL cholesterol level 01/17/2016   Gastroesophageal reflux disease with hiatal hernia 01/17/2016   Epigastric pain 07/27/2015   Dysmenorrhea 08/18/2011    Past Surgical History:  Procedure Laterality Date   ABDOMINAL HYSTERECTOMY     TONSILLECTOMY      OB History   No obstetric history on file.      Home Medications    Prior to Admission medications   Medication Sig Start Date End Date Taking? Authorizing Provider  albuterol (VENTOLIN HFA) 108 (90 Base) MCG/ACT inhaler Inhale into the lungs. 03/25/19   [provider]  estradiol  (ESTRACE) 0.1 MG/GM vaginal cream Estrogen Cream Instruction Discard applicator Apply pea sized amount to tip of finger to urethra before bed. Wash hands well after application. Use Monday, Wednesday and Friday 09/03/22   Sondra Come, MD  fluticasone [facility]) 50 MCG/ACT nasal spray Place into the nose. 07/08/19   [provider]  HYDROcodone-acetaminophen (NORCO/VICODIN) 5-325 MG tablet Take 1 tablet by mouth daily as needed.    [provider]  losartan (COZAAR) 25 MG tablet Take 25 mg by mouth daily.    [provider]  ondansetron (ZOFRAN-ODT) 4 MG disintegrating tablet Take 1 tablet (4 mg total) by mouth every 8 (eight) hours as needed. 12/27/22   Georga Hacking, MD  pantoprazole (PROTONIX) 40 MG tablet Take 40 mg by mouth 2 (two) times daily.    [provider]  propranolol (INDERAL) 10 MG tablet Take by mouth. 11/22/21   [provider]  spironolactone (ALDACTONE) 25 MG tablet Take 25 mg by mouth daily. 03/21/20   [provider]    Family History History reviewed. No pertinent family history.  Social History Social History   Tobacco Use   Smoking status: Never   Smokeless tobacco: Never  Vaping Use   Vaping Use: Every day   Substances: Nicotine  Substance Use Topics   Alcohol use: No   Drug use: No     Allergies   Patient has no known allergies.   Review of Systems Review of Systems  Genitourinary:  Positive for dysuria.  Physical Exam Triage Vital Signs ED Triage Vitals [01/02/23 1502]  Enc Vitals Group     BP 123/85     Pulse Rate 75     Resp 16     Temp 98.4 F (36.9 C)     Temp Source Oral     SpO2 98 %     Weight      Height      Head Circumference      Peak Flow      Pain Score      Pain Loc      Pain Edu?      Excl. in GC?    No data found.  Updated Vital Signs BP 123/85 (BP Location: Left Arm)   Pulse 75   Temp 98.4 F (36.9 C) (Oral)   Resp 16   SpO2 98%   Visual  Acuity Right Eye Distance:   Left Eye Distance:   Bilateral Distance:    Right Eye Near:   Left Eye Near:    Bilateral Near:     Physical Exam Vitals reviewed.  Constitutional:      Appearance: Normal appearance.  Skin:    General: Skin is warm and dry.  Neurological:     General: No focal deficit present.     Mental Status: She is alert and oriented to person, place, and time.  Psychiatric:        Mood and Affect: Mood normal.        Behavior: Behavior normal.      UC Treatments / Results  Labs (all labs ordered are listed, but only abnormal results are displayed) Labs Reviewed  POCT URINALYSIS DIP (MANUAL ENTRY)    EKG   Radiology No results found.  Procedures Procedures (including critical care time)  Medications Ordered in UC Medications - No data to display  Initial Impression / Assessment and Plan / UC Course  I have reviewed the triage vital signs and the nursing notes.  Pertinent labs & imaging results that were available during my care of the patient were reviewed by me and considered in my medical decision making (see chart for details).   Tracy Robertson is a 47 y.o. female presenting with dysuria. Patient is afebrile without recent antipyretics, satting well on room air. Overall is well appearing, well hydrated, without respiratory distress. Pulmonary exam is unremarkable.  Lungs CTAB without wheezing, rhonchi, rales. RRR.  Reviewed relevant chart history.   UA is suggestive of acute cystitis with hematuria.  1+ leukocytes, positive nitrites, large blood.  Will treat with antibiotic therapy.  Counseled patient on potential for adverse effects with medications prescribed/recommended today, ER and return-to-clinic precautions discussed, patient verbalized understanding and agreement with care plan.  Final Clinical Impressions(s) / UC Diagnoses   Final diagnoses:  Dysuria   Discharge Instructions   None    ED Prescriptions   None     PDMP not reviewed this encounter.   Charma Igo, [place] 01/02/23 1519

## 2023-01-03 LAB — URINE CULTURE

## 2023-01-04 LAB — URINE CULTURE: Culture: >=100000 — AB

## 2023-05-03 IMAGING — CT CT ABD-PELV W/ CM
2 of 5 series · 15 of 46 positions shown, 17 images · IV contrast (APPLIED)
Comparison: August 04, 2017.

CLINICAL DATA: A 45-year-old female presents with LEFT lower
quadrant abdominal pain.

EXAM:
CT ABDOMEN AND PELVIS WITH CONTRAST
TECHNIQUE: Multidetector CT imaging of the abdomen and pelvis was performed
using the standard protocol following bolus administration of
intravenous contrast.

[Series 2: abdomen 5.0 · axial · 0.73mm/px · z∈[-853,-423]mm · 12 of 100 slices shown, 14 images]
[im 7/100  soft-tissue]
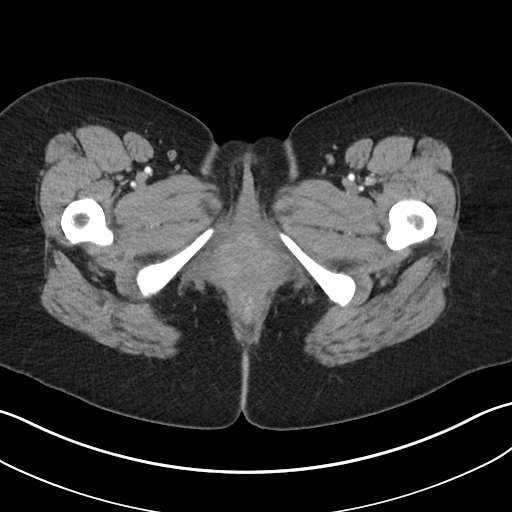
[im 7/100  bone]
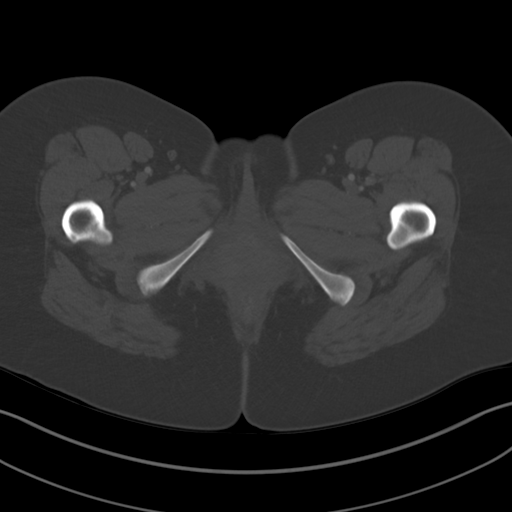
[im 14/100  soft-tissue]
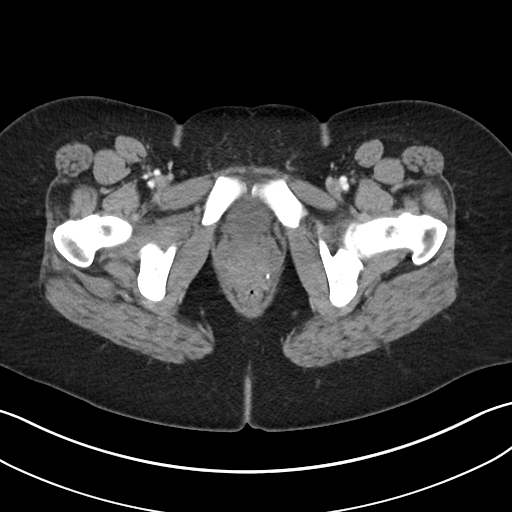
[im 20/100  soft-tissue]
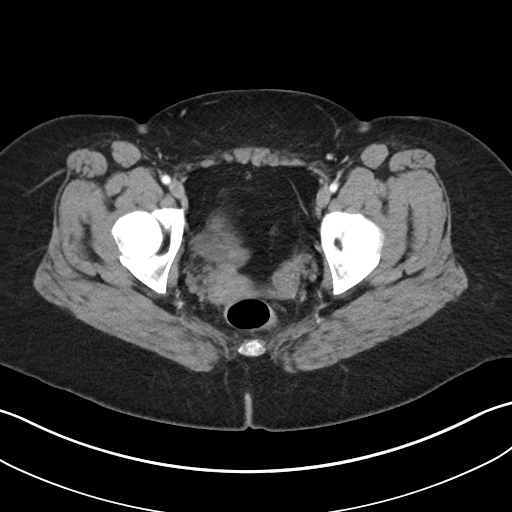
[im 34/100  soft-tissue]
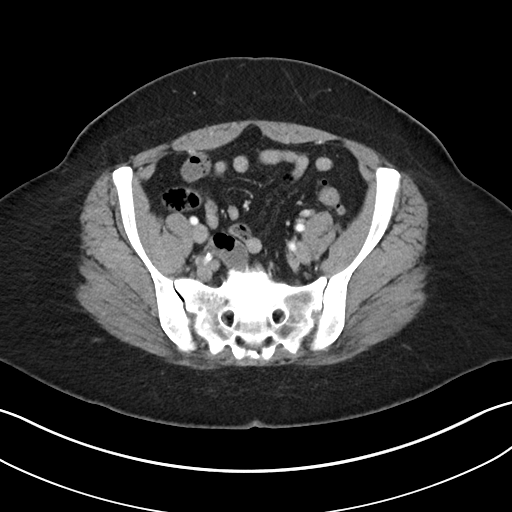
[im 40/100  soft-tissue]
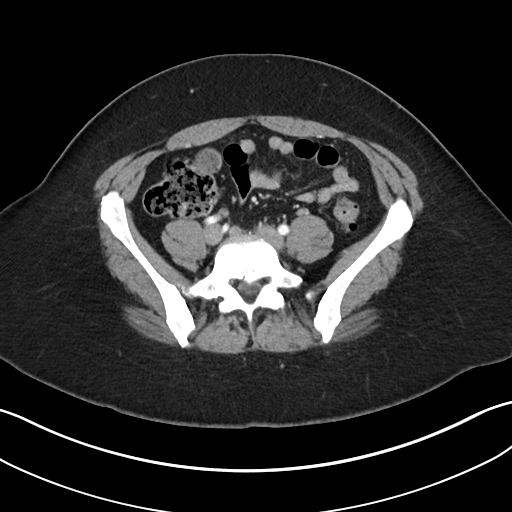
[im 47/100  soft-tissue]
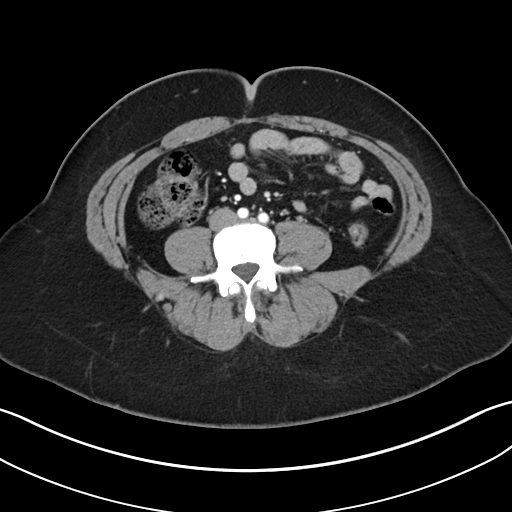
[im 53/100  soft-tissue]
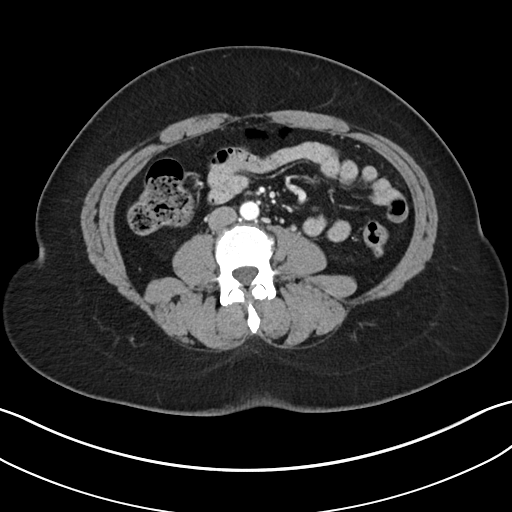
[im 60/100  soft-tissue]
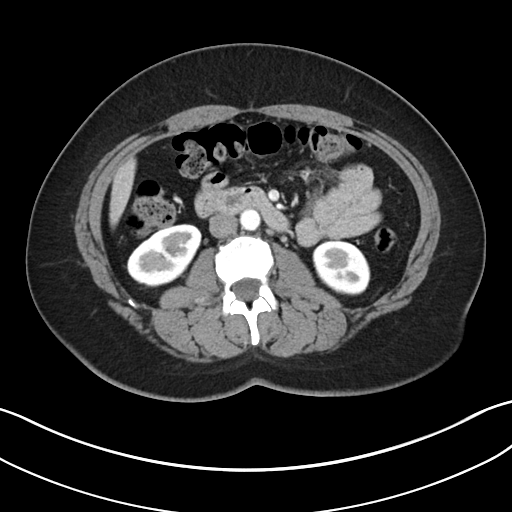
[im 67/100  soft-tissue]
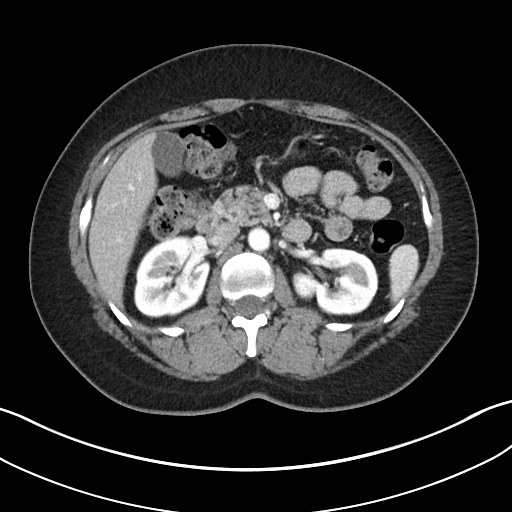
[im 67/100  bone]
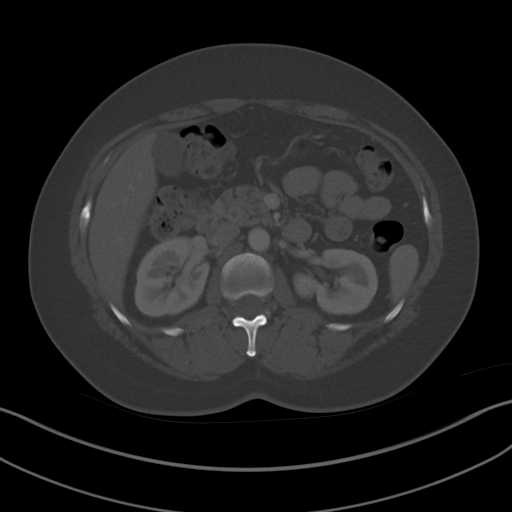
[im 80/100  soft-tissue]
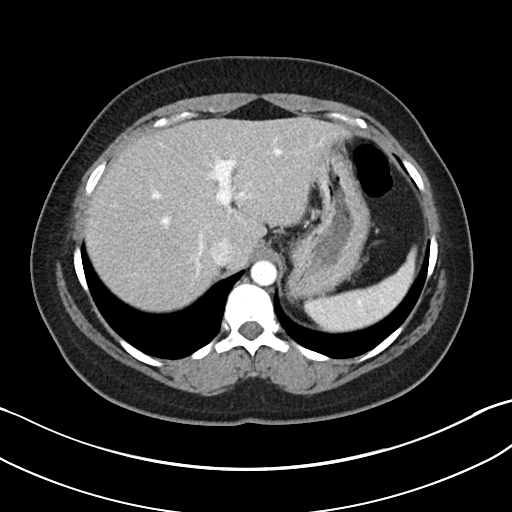
[im 86/100  soft-tissue]
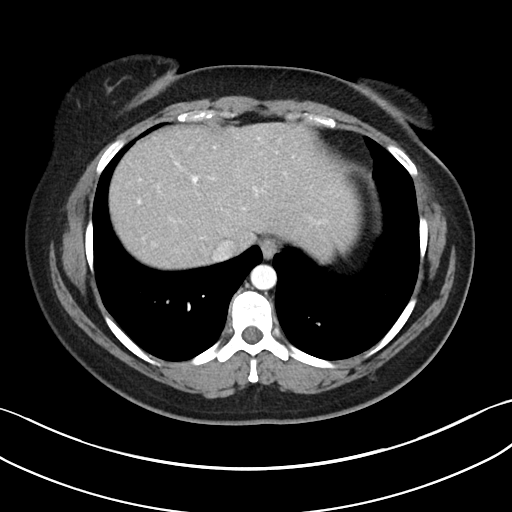
[im 93/100  soft-tissue]
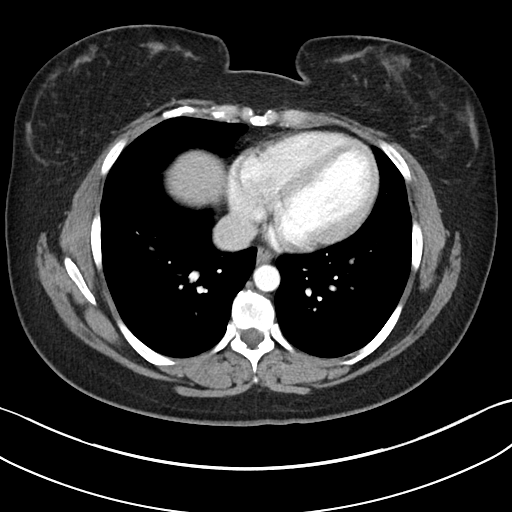

[Series 5: abdomen 3.0 mpr cor · coronal · 0.65mm/px · 3 of 101 slices shown]
[im 34/101  soft-tissue]
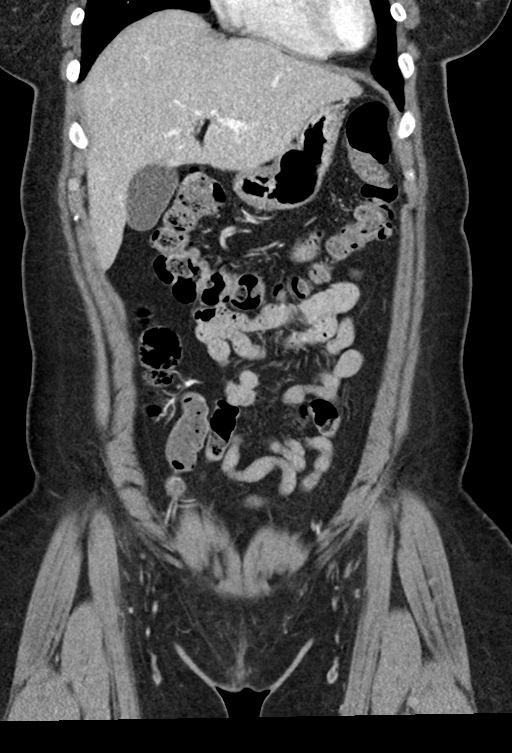
[im 45/101  soft-tissue]
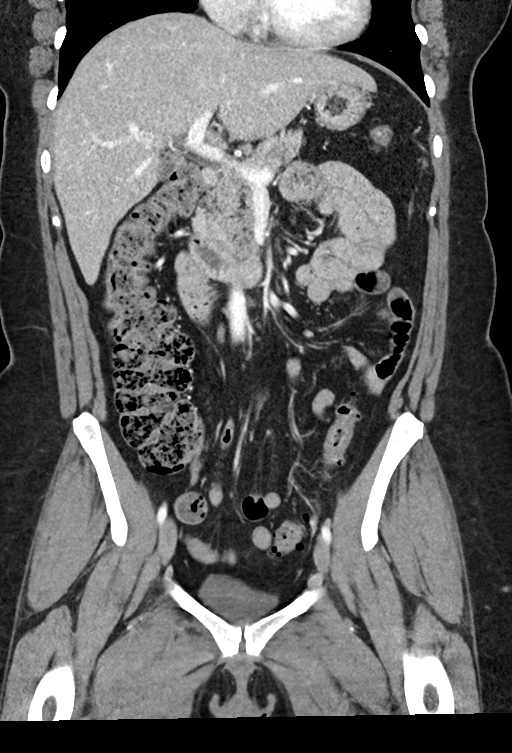
[im 56/101  soft-tissue]
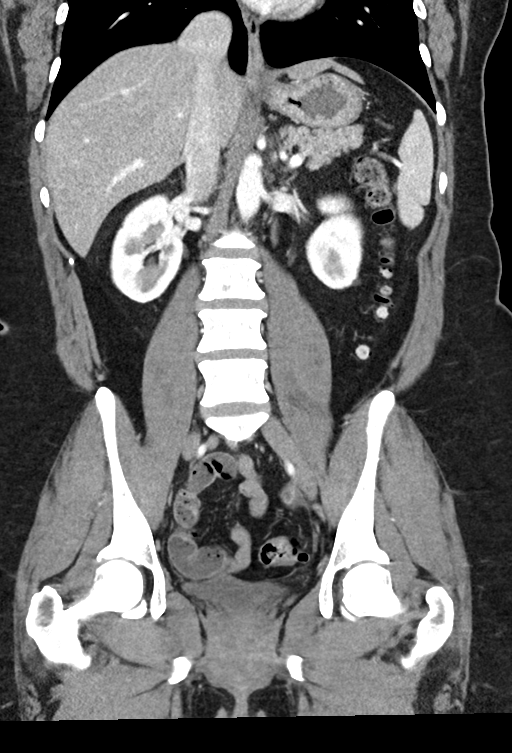

[15 of 46 positions shown; findings below may reference images not displayed]

RADIATION DOSE REDUCTION: This exam was performed according to the
departmental dose-optimization program which includes automated
exposure control, adjustment of the mA and/or kV according to
patient size and/or use of iterative reconstruction technique.

CONTRAST:  100mL OMNIPAQUE IOHEXOL 300 MG/ML  SOLN
FINDINGS: Lower chest: Lung bases are clear.  No effusion or consolidation.

Hepatobiliary: No focal, suspicious hepatic lesion. No
pericholecystic stranding. No biliary duct dilation. Portal vein is
patent. Signs of moderate hepatic steatosis with fat intensification
about the fissure for false form ligament finding that is unchanged.

Pancreas: Normal, without mass, inflammation or ductal dilatation.

Spleen: Normal.

Adrenals/Urinary Tract:

Adrenal glands are unremarkable. Symmetric renal enhancement. No
sign of hydronephrosis. No suspicious renal lesion or perinephric
stranding.

Urinary bladder is grossly unremarkable.

Stomach/Bowel: Diverticular disease in the sigmoid. Diverticulosis
from descending colon through the sigmoid colon. No pericolonic
stranding. No acute gastrointestinal process. Appendix not
visualized but no secondary signs to suggest acute appendicitis.
Stool in the ascending colon is moderate.

Vascular/Lymphatic:

Aorta with smooth contours. IVC with smooth contours. No aneurysmal
dilation of the abdominal aorta. There is no gastrohepatic or
hepatoduodenal ligament lymphadenopathy. No retroperitoneal or
mesenteric lymphadenopathy.

No pelvic sidewall lymphadenopathy.

Reproductive: No sign of adnexal mass.  Post hysterectomy.

Other: No ascites.  No pneumoperitoneum.

Musculoskeletal: No acute bone finding. No destructive bone process.
Spinal degenerative changes.
IMPRESSION: 1. No definite acute intra-abdominal or pelvic process.
2. Diverticular disease of the sigmoid with perhaps slightly
increased segmental thickening compared to previous imaging may
reflect worsening of diverticular process or low level inflammation.
Given advanced diverticular changes and presence of colonic
thickening consider follow-up colonoscopy or comparison with prior
colonoscopy results if available.
3. Signs of moderate hepatic steatosis.

## 2023-05-04 IMAGING — MR MR HEAD W/O CM
12 series · 48 of 48 positions shown · non-contrast
Comparison: CTA/CTP 8888 hours today and earlier.

CLINICAL DATA: 45-year-old female code stroke presentation. Left
side weakness.

Unrevealing plain head CT, CTA and CT Perfusion earlier today.
EXAM:
MRI HEAD WITHOUT CONTRAST
TECHNIQUE: Multiplanar, multiecho pulse sequences of the brain and surrounding
structures were obtained without intravenous contrast.

[Series 5: ax dwi_tracew · axial · 3.0mm · 0.65mm/px · z∈[-109,+30]mm · 3 of 44 slices shown]
[im 1/44]
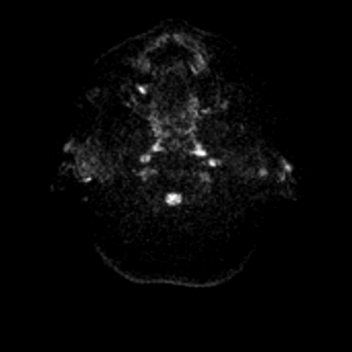
[im 22/44]
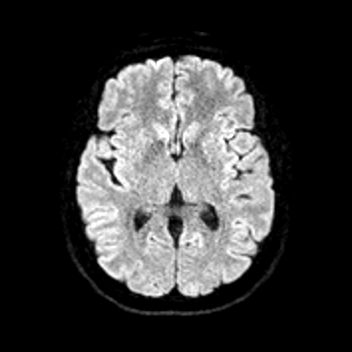
[im 44/44]
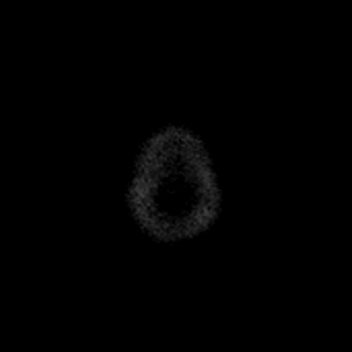

[Series 6: ax dwi_adc · axial · 3.0mm · 0.65mm/px · z∈[-109,+30]mm · 3 of 44 slices shown]
[im 1/44]
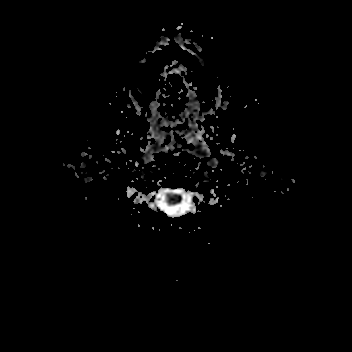
[im 22/44]
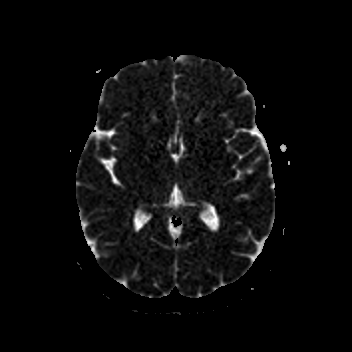
[im 44/44]
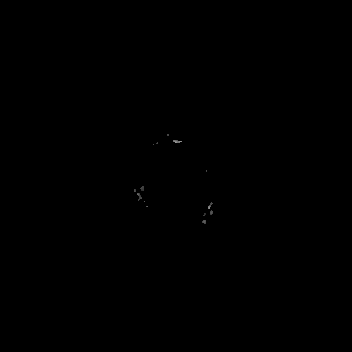

[Series 7: cor dwi_tracew · coronal · 5.0mm · 0.60mm/px · 3 of 34 slices shown]
[im 1/34]
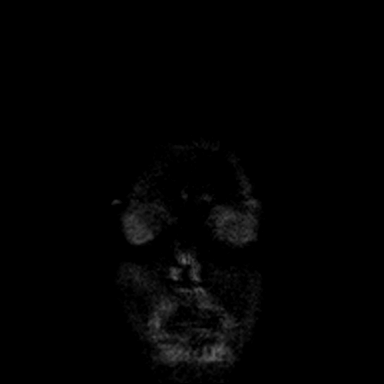
[im 17/34]
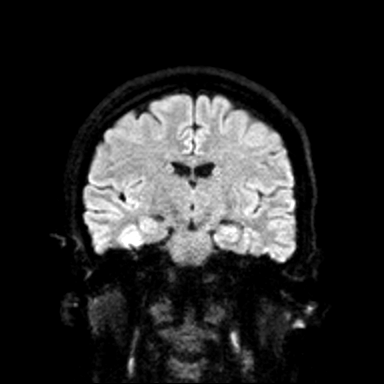
[im 34/34]
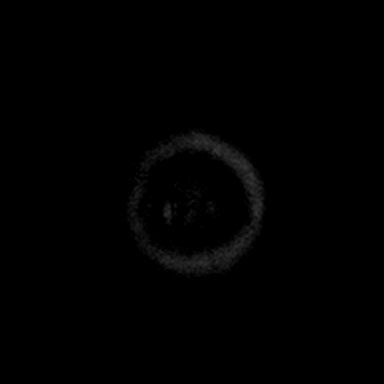

[Series 8: cor dwi_adc · coronal · 5.0mm · 0.60mm/px · 2 of 33 slices shown]
[im 1/33]
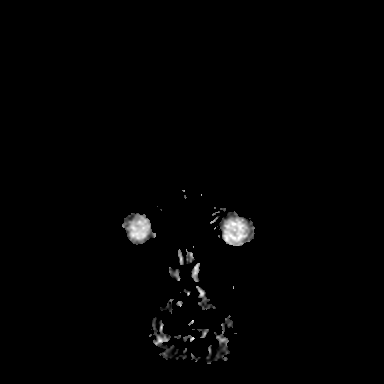
[im 33/33]
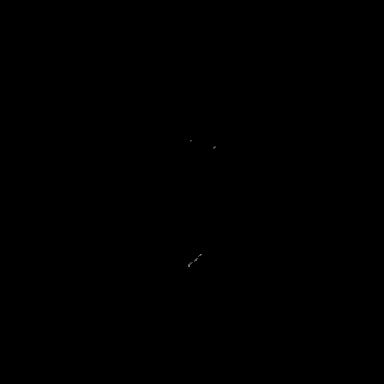

[Series 9: T1 · sagittal · 5.0mm · 0.62mm/px · 2 of 22 slices shown (1 of 2)]
[im 1/22]
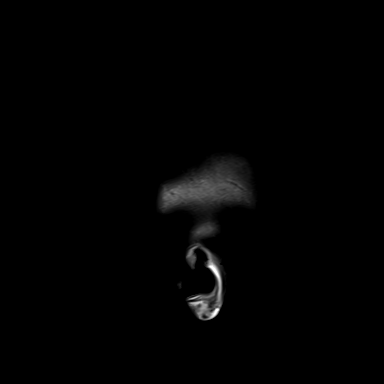
[im 22/22]
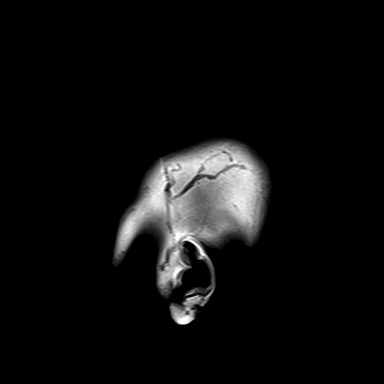

[Series 10: T2 · axial · 5.0mm · 0.53mm/px · z∈[-106,+29]mm · 2 of 24 slices shown (1 of 2)]
[im 1/24]
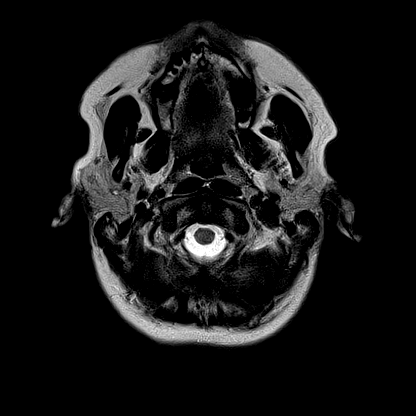
[im 24/24]
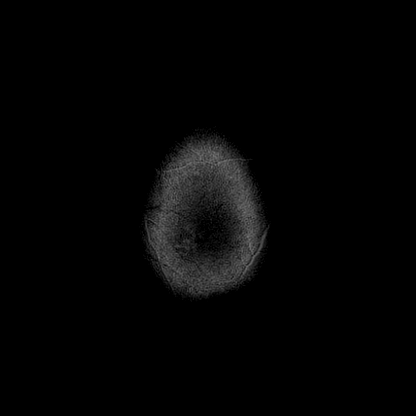

[Series 11: ax swi_mag · axial · 2.0mm · 0.90mm/px · z∈[-109,+30]mm · 5 of 72 slices shown]
[im 1/72]
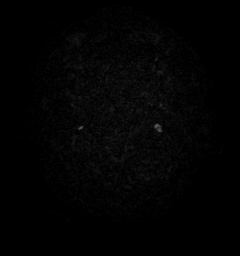
[im 18/72]
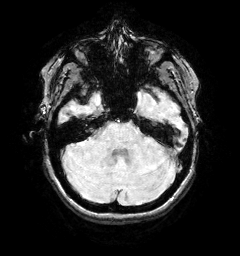
[im 36/72]
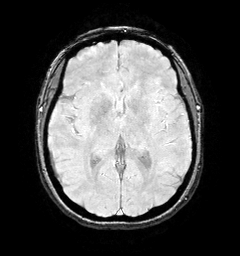
[im 54/72]
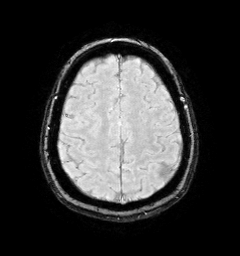
[im 72/72]
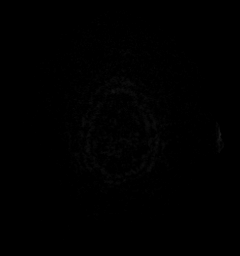

[Series 12: ax swi_pha · axial · 2.0mm · 0.90mm/px · z∈[-109,+30]mm · 5 of 72 slices shown]
[im 1/72]
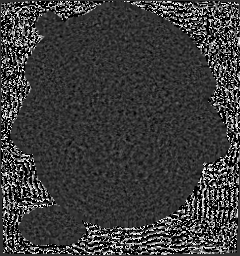
[im 18/72]
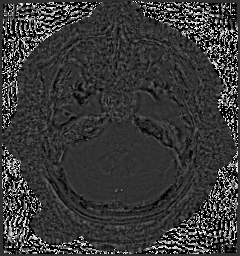
[im 36/72]
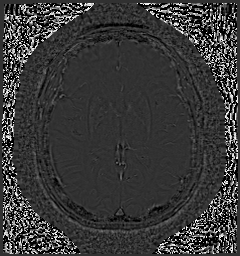
[im 54/72]
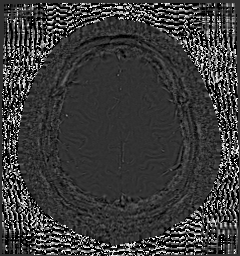
[im 72/72]
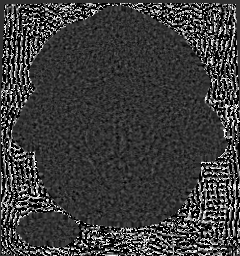

[Series 13: ax swi_swi · axial · 2.0mm · 0.90mm/px · z∈[-109,+30]mm · 5 of 72 slices shown]
[im 1/72]
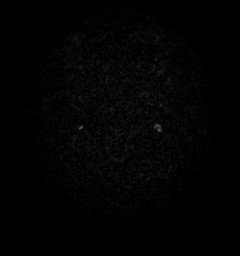
[im 18/72]
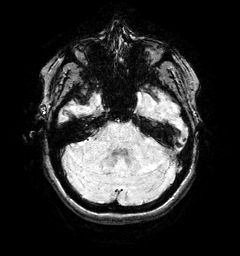
[im 36/72]
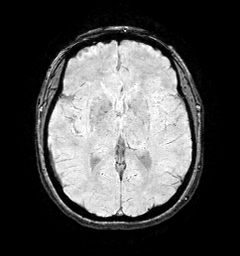
[im 54/72]
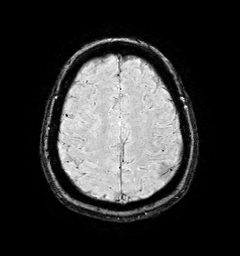
[im 72/72]
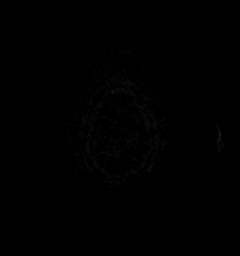

[Series 15: FLAIR · axial · 3.0mm · 0.53mm/px · z∈[-107,+30]mm · 4 of 48 slices shown]
[im 1/48]
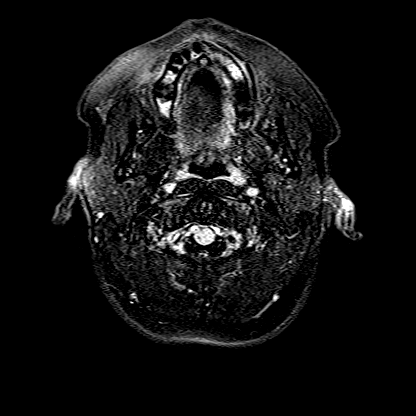
[im 16/48]
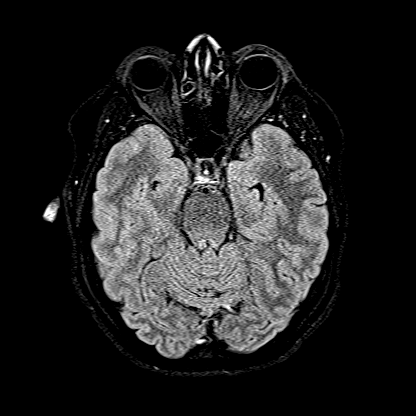
[im 32/48]
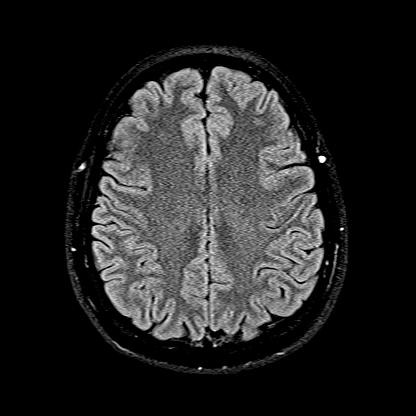
[im 48/48]
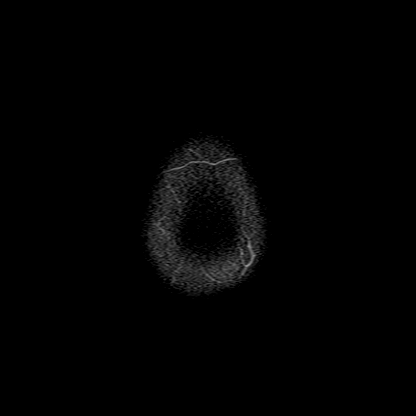

[Series 16: T1 · axial · 1.0mm · 0.98mm/px · z∈[-119,+36]mm · 12 of 160 slices shown (2 of 2)]
[im 1/160]
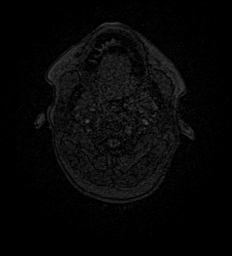
[im 15/160]
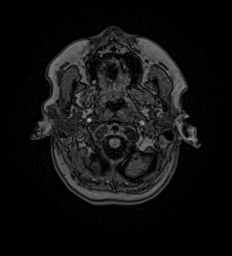
[im 29/160]
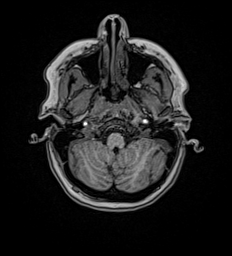
[im 44/160]
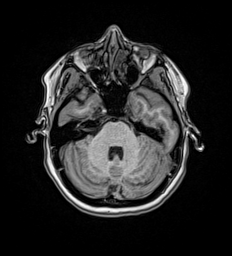
[im 58/160]
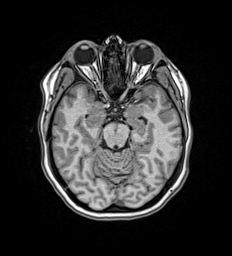
[im 73/160]
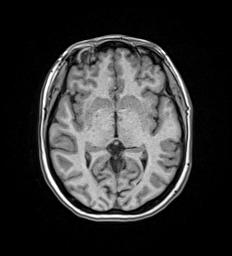
[im 87/160]
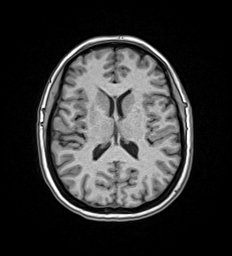
[im 102/160]
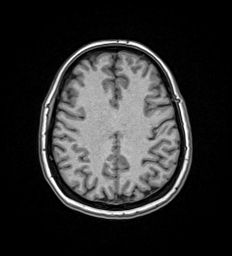
[im 116/160]
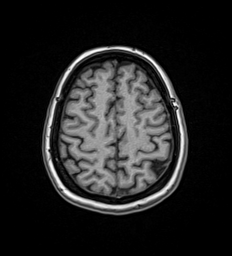
[im 131/160]
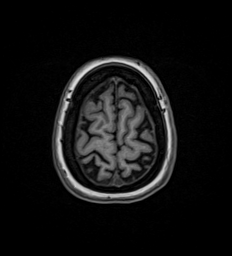
[im 145/160]
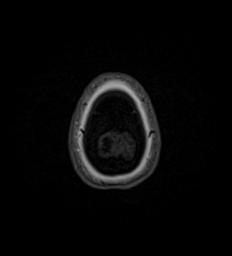
[im 160/160]
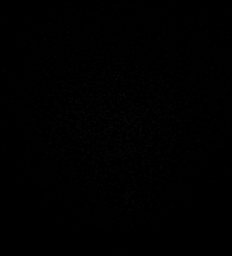

[Series 17: T2 · coronal · 5.0mm · 0.57mm/px · 2 of 26 slices shown (2 of 2)]
[im 1/26]
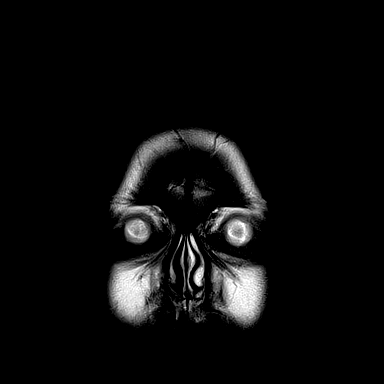
[im 26/26]
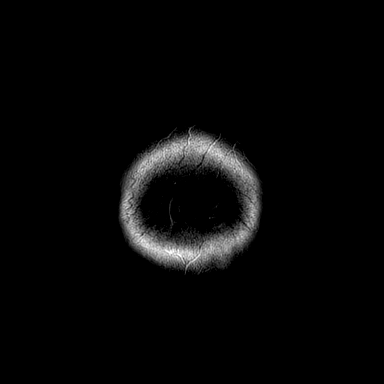

[48 of 48 positions shown; findings below may reference images not displayed]

FINDINGS: Brain: No restricted diffusion to suggest acute infarction. No
midline shift, mass effect, evidence of mass lesion,
ventriculomegaly, extra-axial collection or acute intracranial
hemorrhage. Cervicomedullary junction and pituitary are within
normal limits.

Normal cerebral volume.

Gray and white matter signal appears normal for age throughout the
brain. No convincing encephalomalacia or chronic cerebral blood
products. Deep gray matter nuclei, brainstem and cerebellum appear
normal.

Vascular: Major intracranial vascular flow voids are preserved.

Skull and upper cervical spine: Normal visible cervical spine.
Visualized bone marrow signal is within normal limits.

Sinuses/Orbits: Negative.

Other: Visible internal auditory structures appear normal. Negative
visible scalp and face.
IMPRESSION: Normal noncontrast MRI appearance of the brain.

## 2023-06-13 ENCOUNTER — Ambulatory Visit: Admission: EM | Admit: 2023-06-13 | Discharge: 2023-06-13 | Discharge status: Against Medical Advice | Payer: 59

## 2023-06-14 ENCOUNTER — Ambulatory Visit: Payer: 59

## 2023-09-22 ENCOUNTER — Encounter: Payer: Self-pay | Admitting: *Deleted

## 2023-09-24 ENCOUNTER — Encounter: Payer: Self-pay | Admitting: *Deleted

## 2023-10-02 ENCOUNTER — Encounter: Payer: Self-pay | Admitting: Gastroenterology

## 2023-10-02 ENCOUNTER — Ambulatory Visit: Admitting: Anesthesiology

## 2023-10-02 ENCOUNTER — Ambulatory Visit
Admission: RE | Admit: 2023-10-02 | Discharge: 2023-10-02 | Disposition: A | Discharge status: Home / Self Care | Payer: 59 | Attending: Gastroenterology | Admitting: Gastroenterology

## 2023-10-02 ENCOUNTER — Encounter
Admission: RE | Disposition: A | Discharge status: Home / Self Care | Payer: Self-pay | Source: Home / Self Care | Attending: Gastroenterology

## 2023-10-02 DIAGNOSIS — K2970 Gastritis, unspecified, without bleeding: Secondary | ICD-10-CM | POA: Diagnosis not present

## 2023-10-02 DIAGNOSIS — Z83719 Family history of colon polyps, unspecified: Secondary | ICD-10-CM | POA: Diagnosis not present

## 2023-10-02 DIAGNOSIS — I10 Essential (primary) hypertension: Secondary | ICD-10-CM | POA: Diagnosis not present

## 2023-10-02 DIAGNOSIS — Z800 Family history of malignant neoplasm of digestive organs: Secondary | ICD-10-CM | POA: Diagnosis not present

## 2023-10-02 DIAGNOSIS — K640 First degree hemorrhoids: Secondary | ICD-10-CM | POA: Diagnosis not present

## 2023-10-02 DIAGNOSIS — K5730 Diverticulosis of large intestine without perforation or abscess without bleeding: Secondary | ICD-10-CM | POA: Diagnosis not present

## 2023-10-02 DIAGNOSIS — Z1211 Encounter for screening for malignant neoplasm of colon: Secondary | ICD-10-CM | POA: Insufficient documentation

## 2023-10-02 HISTORY — DX: Other intervertebral disc degeneration, thoracic region: M51.34

## 2023-10-02 HISTORY — PX: ESOPHAGOGASTRODUODENOSCOPY (EGD) WITH PROPOFOL: SHX5813

## 2023-10-02 HISTORY — PX: COLONOSCOPY WITH PROPOFOL: SHX5780

## 2023-10-02 HISTORY — DX: Other intervertebral disc degeneration, lumbosacral region without mention of lumbar back pain or lower extremity pain: M51.379

## 2023-10-02 SURGERY — COLONOSCOPY WITH PROPOFOL
Anesthesia: General

## 2023-10-02 MED ORDER — DEXMEDETOMIDINE HCL IN NACL 80 MCG/20ML IV SOLN
INTRAVENOUS | Status: DC | PRN
  Administered 2023-10-02: 20 ug via INTRAVENOUS

## 2023-10-02 MED ORDER — PROPOFOL 500 MG/50ML IV EMUL
INTRAVENOUS | Status: DC | PRN
  Administered 2023-10-02: 125 ug/kg/min via INTRAVENOUS

## 2023-10-02 MED ORDER — GLYCOPYRROLATE 0.2 MG/ML IJ SOLN
INTRAMUSCULAR | Status: DC | PRN
  Administered 2023-10-02: .2 mg via INTRAVENOUS

## 2023-10-02 MED ORDER — PROPOFOL 1000 MG/100ML IV EMUL
INTRAVENOUS | Status: AC
  Filled 2023-10-02: qty 100

## 2023-10-02 MED ORDER — GLYCOPYRROLATE 0.2 MG/ML IJ SOLN
INTRAMUSCULAR | Status: AC
  Filled 2023-10-02: qty 1

## 2023-10-02 MED ORDER — LIDOCAINE HCL (CARDIAC) PF 100 MG/5ML IV SOSY
PREFILLED_SYRINGE | INTRAVENOUS | Status: DC | PRN
  Administered 2023-10-02: 50 mg via INTRAVENOUS

## 2023-10-02 MED ORDER — PROPOFOL 10 MG/ML IV BOLUS
INTRAVENOUS | Status: DC | PRN
  Administered 2023-10-02: 30 mg via INTRAVENOUS
  Administered 2023-10-02: 50 mg via INTRAVENOUS

## 2023-10-02 MED ORDER — LIDOCAINE HCL (PF) 2 % IJ SOLN
INTRAMUSCULAR | Status: AC
  Filled 2023-10-02: qty 5

## 2023-10-02 MED ORDER — SODIUM CHLORIDE 0.9 % IV SOLN
INTRAVENOUS | Status: DC
  Administered 2023-10-02: 20 mL/h via INTRAVENOUS

## 2023-10-02 NOTE — Op Note (Signed)
 [facility] Gastroenterology Patient Name: Tracy Robertson Procedure Date: 10/02/2023 9:32 AM MRN: [id number] Account #: [id number] Date of Birth: [birth year] Admit Type: Outpatient Age: 48 Room: [facility] ENDO ROOM 3 Gender: Female Note Status: Finalized Instrument Name: Upper Endoscope [id number] Procedure:             Upper GI endoscopy Indications:           Gastro-esophageal reflux disease Providers:             Eather Colas MD, MD Referring MD:          Eather Colas MD, MD (Referring MD) Medicines:             Monitored Anesthesia Care Complications:         No immediate complications. Estimated blood loss:                         Minimal. Procedure:             Pre-Anesthesia Assessment:                        - Prior to the procedure, a History and Physical was                         performed, and patient medications and allergies were                         reviewed. The patient is competent. The risks and                         benefits of the procedure and the sedation options and                         risks were discussed with the patient. All questions                         were answered and informed consent was obtained.                         Patient identification and proposed procedure were                         verified by the physician, the nurse, the                         anesthesiologist, the anesthetist and the technician                         in the endoscopy suite. Mental Status Examination:                         alert and oriented. Airway Examination: normal                         oropharyngeal airway and neck mobility. Respiratory                         Examination: clear to auscultation. CV Examination:  normal. Prophylactic Antibiotics: The patient does not                         require prophylactic antibiotics. Prior                         Anticoagulants: The patient has taken no  anticoagulant                         or antiplatelet agents. ASA Grade Assessment: II - A                         patient with mild systemic disease. After reviewing                         the risks and benefits, the patient was deemed in                         satisfactory condition to undergo the procedure. The                         anesthesia plan was to use monitored anesthesia care                         (MAC). Immediately prior to administration of                         medications, the patient was re-assessed for adequacy                         to receive sedatives. The heart rate, respiratory                         rate, oxygen saturations, blood pressure, adequacy of                         pulmonary ventilation, and response to care were                         monitored throughout the procedure. The physical                         status of the patient was re-assessed after the                         procedure.                        After obtaining informed consent, the endoscope was                         passed under direct vision. Throughout the procedure,                         the patient's blood pressure, pulse, and oxygen                         saturations were monitored continuously. The Endoscope  was introduced through the mouth, and advanced to the                         second part of duodenum. The upper GI endoscopy was                         accomplished without difficulty. The patient tolerated                         the procedure well. Findings:      The examined esophagus was normal.      Patchy mild inflammation characterized by erythema was found in the       gastric antrum. Biopsies were taken with a cold forceps for Helicobacter       pylori testing. Estimated blood loss was minimal.      The examined duodenum was normal. Impression:            - Normal esophagus.                        - Gastritis. Biopsied.                         - Normal examined duodenum. Recommendation:        - Discharge patient to home.                        - Resume previous diet.                        - Continue present medications.                        - Await pathology results.                        - Return to referring physician as previously                         scheduled. Procedure Code(s):     --- Professional ---                        [phone], Esophagogastroduodenoscopy, flexible,                         transoral; with biopsy, single or multiple Diagnosis Code(s):     --- Professional ---                        K29.70, Gastritis, unspecified, without bleeding                        K21.9, Gastro-esophageal reflux disease without                         esophagitis CPT copyright 2022 American Medical Association. All rights reserved. The codes documented in this report are preliminary and upon coder review may  be revised to meet current compliance requirements. Eather Colas MD, MD 10/02/2023 10:27:57 AM Number of Addenda: 0 Note Initiated On: 10/02/2023 9:32 AM Estimated Blood Loss:  Estimated blood loss was minimal.      [facility]

## 2023-10-02 NOTE — Op Note (Signed)
 [facility] Gastroenterology Patient Name: Tracy Robertson Procedure Date: 10/02/2023 9:31 AM MRN: [id number] Account #: [id number] Date of Birth: May 08, 1976 Admit Type: Outpatient Age: 48 Room: [facility] ENDO ROOM 3 Gender: Female Note Status: Finalized Instrument Name: Nelda Marseille [id number] Procedure:             Colonoscopy Indications:           Colon cancer screening in patient at increased risk:                         Family history of 1st-degree relative with colon polyps Providers:             Eather Colas MD, MD Referring MD:          Eather Colas MD, MD (Referring MD) Medicines:             Monitored Anesthesia Care Complications:         No immediate complications. Procedure:             Pre-Anesthesia Assessment:                        - Prior to the procedure, a History and Physical was                         performed, and patient medications and allergies were                         reviewed. The patient is competent. The risks and                         benefits of the procedure and the sedation options and                         risks were discussed with the patient. All questions                         were answered and informed consent was obtained.                         Patient identification and proposed procedure were                         verified by the physician, the nurse, the                         anesthesiologist, the anesthetist and the technician                         in the endoscopy suite. Mental Status Examination:                         alert and oriented. Airway Examination: normal                         oropharyngeal airway and neck mobility. Respiratory                         Examination: clear to auscultation. CV Examination:  normal. Prophylactic Antibiotics: The patient does not                         require prophylactic antibiotics. Prior                         Anticoagulants: The  patient has taken no anticoagulant                         or antiplatelet agents. ASA Grade Assessment: II - A                         patient with mild systemic disease. After reviewing                         the risks and benefits, the patient was deemed in                         satisfactory condition to undergo the procedure. The                         anesthesia plan was to use monitored anesthesia care                         (MAC). Immediately prior to administration of                         medications, the patient was re-assessed for adequacy                         to receive sedatives. The heart rate, respiratory                         rate, oxygen saturations, blood pressure, adequacy of                         pulmonary ventilation, and response to care were                         monitored throughout the procedure. The physical                         status of the patient was re-assessed after the                         procedure.                        After obtaining informed consent, the colonoscope was                         passed under direct vision. Throughout the procedure,                         the patient's blood pressure, pulse, and oxygen                         saturations were monitored continuously. The  Colonoscope was introduced through the anus and                         advanced to the the terminal ileum, with                         identification of the appendiceal orifice and IC                         valve. The colonoscopy was performed without                         difficulty. The patient tolerated the procedure well.                         The quality of the bowel preparation was good. The                         terminal ileum, ileocecal valve, appendiceal orifice,                         and rectum were photographed. Findings:      The perianal and digital rectal examinations were normal.      The terminal  ileum appeared normal.      Multiple small-mouthed diverticula were found in the sigmoid colon and       descending colon.      Internal hemorrhoids were found during retroflexion. The hemorrhoids       were Grade I (internal hemorrhoids that do not prolapse).      The exam was otherwise without abnormality on direct and retroflexion       views. Impression:            - The examined portion of the ileum was normal.                        - Diverticulosis in the sigmoid colon and in the                         descending colon.                        - Internal hemorrhoids.                        - The examination was otherwise normal on direct and                         retroflexion views.                        - No specimens collected. Recommendation:        - Discharge patient to home.                        - Resume previous diet.                        - Continue present medications.                        -  Repeat colonoscopy in 5 years for screening purposes.                        - Return to referring physician as previously                         scheduled. Procedure Code(s):     --- Professional ---                        Z6109, Colorectal cancer screening; colonoscopy on                         individual at high risk Diagnosis Code(s):     --- Professional ---                        Z83.71, Family history of colonic polyps                        K64.0, First degree hemorrhoids                        K57.30, Diverticulosis of large intestine without                         perforation or abscess without bleeding CPT copyright 2022 American Medical Association. All rights reserved. The codes documented in this report are preliminary and upon coder review may  be revised to meet current compliance requirements. Eather Colas MD, MD 10/02/2023 10:30:40 AM Number of Addenda: 0 Note Initiated On: 10/02/2023 9:31 AM Scope Withdrawal Time: 0 hours 8 minutes 0 seconds   Total Procedure Duration: 0 hours 12 minutes 2 seconds  Estimated Blood Loss:  Estimated blood loss: none.      [facility]

## 2023-10-02 NOTE — Interval H&P Note (Signed)
 History and Physical Interval Note:  10/02/2023 9:52 AM  Tracy Robertson  has presented today for surgery, with the diagnosis of GERD,fanily hx of polyps in the colon.  The various methods of treatment have been discussed with the patient and family. After consideration of risks, benefits and other options for treatment, the patient has consented to  Procedure(s): COLONOSCOPY WITH PROPOFOL (N/A) ESOPHAGOGASTRODUODENOSCOPY (EGD) WITH PROPOFOL (N/A) as a surgical intervention.  The patient's history has been reviewed, patient examined, no change in status, stable for surgery.  I have reviewed the patient's chart and labs.  Questions were answered to the patient's satisfaction.     Regis Bill  Ok to proceed with EGD/Colonoscopy

## 2023-10-02 NOTE — Transfer of Care (Signed)
 Immediate Anesthesia Transfer of Care Note  Patient: Tracy Robertson  Procedure(s) Performed: COLONOSCOPY WITH PROPOFOL ESOPHAGOGASTRODUODENOSCOPY (EGD) WITH PROPOFOL  Patient Location: PACU  Anesthesia Type:General  Level of Consciousness: sedated  Airway & Oxygen Therapy: Patient Spontanous Breathing  Post-op Assessment: Report given to RN and Post -op Vital signs reviewed and stable  Post vital signs: Reviewed and stable  Last Vitals:  Vitals Value Taken Time  BP    Temp    Pulse 91 10/02/23 1025  Resp 19 10/02/23 1025  SpO2 97 % 10/02/23 1025  Vitals shown include unfiled device data.  Last Pain:  Vitals:   10/02/23 0929  TempSrc: Temporal  PainSc: 0-No pain         Complications: No notable events documented.

## 2023-10-02 NOTE — Anesthesia Postprocedure Evaluation (Signed)
 Anesthesia Post Note  Patient: Tracy Robertson  Procedure(s) Performed: COLONOSCOPY WITH PROPOFOL ESOPHAGOGASTRODUODENOSCOPY (EGD) WITH PROPOFOL  Patient location during evaluation: PACU Anesthesia Type: General Level of consciousness: awake Pain management: satisfactory to patient Vital Signs Assessment: post-procedure vital signs reviewed and stable Respiratory status: spontaneous breathing Cardiovascular status: stable Anesthetic complications: no   No notable events documented.   Last Vitals:  Vitals:   10/02/23 1035 10/02/23 1045  BP: 107/80   Pulse:    Resp:    Temp:    SpO2: 98% 98%    Last Pain:  Vitals:   10/02/23 1045  TempSrc:   PainSc: 0-No pain                 [name],Tracy Disano

## 2023-10-02 NOTE — H&P (Signed)
 Outpatient short stay form Pre-procedure 10/02/2023  Regis Bill, MD  Primary Physician: [facility]  Reason for visit:  GERD/Colon cancer screening  History of present illness:    48 y/o lady with history of hypertension and GERD here for EGD for worsening GERD and index screening colonoscopy. Father with large colon polyps that required surgery. Father also had esophageal cancer. History of hysterectomy. No blood thinners.    Current Facility-Administered Medications:    0.9 %  sodium chloride infusion, , Intravenous, Continuous, Tawnia Schirm, Rossie Muskrat, MD, Last Rate: 20 mL/hr at 10/02/23 0943, 20 mL/hr at 10/02/23 8295  Facility-Administered Medications Ordered in Other Encounters:    glycopyrrolate (ROBINUL) injection, , Intravenous, Anesthesia Intra-op, Deland Pretty, CRNA, 0.2 mg at 10/02/23 0948  Medications Prior to Admission  Medication Sig Dispense Refill Last Dose/Taking   albuterol (VENTOLIN HFA) 108 (90 Base) MCG/ACT inhaler Inhale into the lungs.   Past Week   estradiol (ESTRACE) 0.1 MG/GM vaginal cream Estrogen Cream Instruction Discard applicator Apply pea sized amount to tip of finger to urethra before bed. Wash hands well after application. Use Monday, Wednesday and Friday 42.5 g 12 Past Week   fluticasone (FLONASE) 50 MCG/ACT nasal spray Place into the nose.   Past Week   HYDROcodone-acetaminophen (NORCO/VICODIN) 5-325 MG tablet Take 1 tablet by mouth daily as needed.   Past Week   losartan (COZAAR) 25 MG tablet Take 25 mg by mouth daily.   Past Week   ondansetron (ZOFRAN-ODT) 4 MG disintegrating tablet Take 1 tablet (4 mg total) by mouth every 8 (eight) hours as needed. 20 tablet 0 Past Week   pantoprazole (PROTONIX) 40 MG tablet Take 40 mg by mouth 2 (two) times daily.   Past Week   propranolol (INDERAL) 10 MG tablet Take by mouth.   Past Week   spironolactone (ALDACTONE) 25 MG tablet Take 25 mg by mouth daily.   Past Week     No Known  Allergies   Past Medical History:  Diagnosis Date   DDD (degenerative disc disease), lumbosacral    DDD (degenerative disc disease), thoracic    Hypertension     Review of systems:  Otherwise negative.    Physical Exam  Gen: Alert, oriented. Appears stated age.  HEENT: PERRLA. Lungs: No respiratory distress CV: RRR Abd: soft, benign, no masses Ext: No edema    Planned procedures: Proceed with EGD/colonoscopy. The patient understands the nature of the planned procedure, indications, risks, alternatives and potential complications including but not limited to bleeding, infection, perforation, damage to internal organs and possible oversedation/side effects from anesthesia. The patient agrees and gives consent to proceed.  Please refer to procedure notes for findings, recommendations and patient disposition/instructions.     Regis Bill, MD [facility]

## 2023-10-02 NOTE — Anesthesia Preprocedure Evaluation (Addendum)
 Anesthesia Evaluation  Patient identified by MRN, date of birth, ID band Patient awake    Reviewed: Allergy & Precautions, NPO status , Patient's Chart, lab work & pertinent test results  Airway Mallampati: II  TM Distance: >3 FB Neck ROM: full    Dental  (+) Teeth Intact   Pulmonary neg pulmonary ROS, asthma , Patient abstained from smoking.   Pulmonary exam normal        Cardiovascular Exercise Tolerance: Good hypertension, Pt. on medications negative cardio ROS Normal cardiovascular exam Rhythm:Regular Rate:Normal     Neuro/Psych  Headaches negative neurological ROS  negative psych ROS   GI/Hepatic negative GI ROS, Neg liver ROS, hiatal hernia,GERD  Medicated,,  Endo/Other  negative endocrine ROS    Renal/GU negative Renal ROS  negative genitourinary   Musculoskeletal   Abdominal Normal abdominal exam  (+)   Peds negative pediatric ROS (+)  Hematology negative hematology ROS (+)   Anesthesia Other Findings Past Medical History: No date: DDD (degenerative disc disease), lumbosacral No date: DDD (degenerative disc disease), thoracic No date: Hypertension  Past Surgical History: No date: ABDOMINAL HYSTERECTOMY No date: TONSILLECTOMY  BMI    Body Mass Index: 29.69 kg/m      Reproductive/Obstetrics negative OB ROS                             Anesthesia Physical Anesthesia Plan  ASA: 2  Anesthesia Plan: General   Post-op Pain Management:    Induction: Intravenous  PONV Risk Score and Plan: Propofol infusion and TIVA  Airway Management Planned: Natural Airway and Nasal Cannula  Additional Equipment:   Intra-op Plan:   Post-operative Plan:   Informed Consent: I have reviewed the patients History and Physical, chart, labs and discussed the procedure including the risks, benefits and alternatives for the proposed anesthesia with the patient or authorized representative  who has indicated his/her understanding and acceptance.     Dental Advisory Given  Plan Discussed with: CRNA and Surgeon  Anesthesia Plan Comments:        Anesthesia Quick Evaluation

## 2023-10-05 LAB — SURGICAL PATHOLOGY

## 2023-11-09 ENCOUNTER — Ambulatory Visit
Admission: EM | Admit: 2023-11-09 | Discharge: 2023-11-09 | Disposition: A | Discharge status: Home / Self Care | Attending: Family Medicine | Admitting: Family Medicine

## 2023-11-09 DIAGNOSIS — R079 Chest pain, unspecified: Secondary | ICD-10-CM

## 2023-11-09 NOTE — ED Provider Notes (Signed)
 Tracy Robertson    CSN: [id number] Arrival date & time: 11/09/23  1910      History   Chief Complaint Chief Complaint  Patient presents with   Chest Pain    HPI Tracy Robertson is a 48 y.o. female reports for chest pain.  Patient reports 3 days of a persistent midsternal chest pain that does not radiate.  She describes it as a soreness that is worse with movement.  Denies any shortness of breath, nausea/vomiting, dizziness, palpitations, syncope.  Has had slight headache.  She does vape tobacco.  No asthma history denies any cough or congestion.  No known injury or inciting event.  Does report a family history of CAD with father having an MI under the age of 7.  She has been taking Tylenol  and 800 mg of ibuprofen around-the-clock for 3 days with no change in her symptoms.  No other concerns at this time.   Chest Pain   Past Medical History:  Diagnosis Date   DDD (degenerative disc disease), lumbosacral    DDD (degenerative disc disease), thoracic    Hypertension     Patient Active Problem List   Diagnosis Date Noted   Acute focal neurologic deficit with complete resolution 11/09/2021   Dysuria 11/09/2021   Mild intermittent asthma without complication 05/15/2020   Pain medication agreement signed 10/25/2019   Bilateral occipital neuralgia 06/24/2019   Bilateral carpal tunnel syndrome 02/04/2019   Spondylosis of lumbar region without myelopathy or radiculopathy 08/26/2018   Chronic pain syndrome 06/22/2018   Joint pain 06/22/2018   Chronic back pain greater than 3 months duration 05/18/2018   DDD (degenerative disc disease), lumbosacral 09/08/2017   Obesity (BMI 35.0-39.9 without comorbidity) 12/22/2016   Numbness and tingling 11/25/2016   Other muscle spasm 11/25/2016   Benign essential hypertension 01/17/2016   Elevated LDL cholesterol level 01/17/2016   Gastroesophageal reflux disease with hiatal hernia 01/17/2016   Epigastric pain 07/27/2015    Dysmenorrhea 08/18/2011    Past Surgical History:  Procedure Laterality Date   ABDOMINAL HYSTERECTOMY     COLONOSCOPY WITH PROPOFOL  N/A 10/02/2023   Procedure: COLONOSCOPY WITH PROPOFOL ;  Surgeon: Shane Darling, MD;  Location: [facility] END[facility]COPY;  Service: Endoscopy;  Laterality: N/A;   ESOPHAGOGASTRODUODENOSCOPY (EGD) WITH PROPOFOL  N/A 10/02/2023   Procedure: ESOPHAGOGASTRODUODENOSCOPY (EGD) WITH PROPOFOL ;  Surgeon: Shane Darling, MD;  Location: [facility] ENDOSCOPY;  Service: Endoscopy;  Laterality: N/A;   TONSILLECTOMY      OB History   No obstetric history on file.      Home Medications    Prior to Admission medications   Medication Sig Start Date End Date Taking? Authorizing Provider  albuterol (VENTOLIN HFA) 108 (90 Base) MCG/ACT inhaler Inhale into the lungs. 03/25/19   [provider]  estradiol  (ESTRACE ) 0.1 MG/GM vaginal cream Estrogen Cream Instruction Discard applicator Apply pea sized amount to tip of finger to urethra before bed. Wash hands well after application. Use Monday, Wednesday and Friday 09/03/22   Lawerence Pressman, MD  fluticasone (FLONASE) 50 MCG/ACT nasal spray Place into the nose. 07/08/19   [provider]  HYDROcodone -acetaminophen  (NORCO/VICODIN) 5-325 MG tablet Take 1 tablet by mouth daily as needed.    [provider]  losartan  (COZAAR ) 25 MG tablet Take 25 mg by mouth daily.    [provider]  ondansetron  (ZOFRAN -ODT) 4 MG disintegrating tablet Take 1 tablet (4 mg total) by mouth every 8 (eight) hours as needed. 12/27/22   Heather Litter, MD  pantoprazole (PROTONIX) 40 MG tablet Take 40 mg by mouth 2 (two) times daily.    [provider]  propranolol (INDERAL) 10 MG tablet Take by mouth. 11/22/21   [provider]  spironolactone  (ALDACTONE ) 25 MG tablet Take 25 mg by mouth daily. 03/21/20   [provider]    Family History History reviewed. No pertinent family history.  Social  History Social History   Tobacco Use   Smoking status: Never   Smokeless tobacco: Never  Vaping Use   Vaping status: Every Day   Substances: Nicotine  Substance Use Topics   Alcohol use: No   Drug use: No     Allergies   Patient has no known allergies.   Review of Systems Review of Systems  Cardiovascular:  Positive for chest pain.     Physical Exam Triage Vital Signs ED Triage Vitals  Encounter Vitals Group     BP 11/09/23 1925 138/89     Systolic BP Percentile --      Diastolic BP Percentile --      Pulse Rate 11/09/23 1925 85     Resp 11/09/23 1925 16     Temp 11/09/23 1925 97.7 F (36.5 C)     Temp Source 11/09/23 1925 Temporal     SpO2 11/09/23 1925 98 %     Weight --      Height --      Head Circumference --      Peak Flow --      Pain Score 11/09/23 1924 8     Pain Loc --      Pain Education --      Exclude from Growth Chart --    No data found.  Updated Vital Signs BP 138/89 (BP Location: Left Arm)   Pulse 85   Temp 97.7 F (36.5 C) (Temporal)   Resp 16   SpO2 98%   Visual Acuity Right Eye Distance:   Left Eye Distance:   Bilateral Distance:    Right Eye Near:   Left Eye Near:    Bilateral Near:     Physical Exam Vitals and nursing note reviewed.  Constitutional:      General: She is not in acute distress.    Appearance: Normal appearance. She is not ill-appearing.  HENT:     Head: Normocephalic and atraumatic.  Eyes:     Pupils: Pupils are equal, round, and reactive to light.  Cardiovascular:     Rate and Rhythm: Normal rate.  Pulmonary:     Effort: Pulmonary effort is normal.  Chest:     Chest wall: Tenderness present.  Skin:    General: Skin is warm and dry.  Neurological:     General: No focal deficit present.     Mental Status: She is alert and oriented to person, place, and time.  Psychiatric:        Mood and Affect: Mood normal.        Behavior: Behavior normal.      UC Treatments / Results  Labs (all labs  ordered are listed, but only abnormal results are displayed) Labs Reviewed - No data to display  EKG   Radiology No results found.  Procedures ED EKG  Date/Time: 11/09/2023 7:44 PM  Performed by: Alleen Arbour, NP Authorized by: Alleen Arbour, NP   ECG interpreted by ED Physician in the absence of a cardiologist: no   Previous ECG:    Previous ECG:  Unavailable Rate:  ECG rate:  85   ECG rate assessment: normal   Rhythm:    Rhythm: sinus rhythm   Ectopy:    Ectopy: none   QRS:    QRS conduction: normal   ST segments:    ST segments:  Normal T waves:    T waves: non-specific    (including critical care time)  Medications Ordered in UC Medications - No data to display  Initial Impression / Assessment and Plan / UC Course  I have reviewed the triage vital signs and the nursing notes.  Pertinent labs & imaging results that were available during my care of the patient were reviewed by me and considered in my medical decision making (see chart for details).     I reviewed exam and symptoms with patient.  Discussed limitations and abilities of urgent care.  EKG with nonspecific T waves otherwise no acute ST-T wave changes.  Discussed possible causes including costochondritis but given her persistent symptoms without any improvement with over-the-counter analgesics at maximum doses, smoking history, and the fact that her father had an MI under the age of 42 I advise she go to the ER to rule out any cardiac causes of her chest pain.  She is in agreement with plan will go POV to the ER.  She was instructed to pull over and call 911 for any worsening symptoms that occur in transit and she verbalized understanding. Final Clinical Impressions(s) / UC Diagnoses   Final diagnoses:  Chest pain, unspecified type     Discharge Instructions      Go to the ER for further evaluation of your symptoms    ED Prescriptions   None    PDMP not reviewed this encounter.   Alleen Arbour, NP 11/09/23 1945

## 2023-11-09 NOTE — Discharge Instructions (Signed)
Go to the ER for further evaluation of your symptoms 

## 2023-11-09 NOTE — ED Notes (Signed)
 Patient is being discharged from the Urgent Care and sent to the Emergency Department via POV with spouse. Per Zettie Hillock, NP, patient is in need of higher level of care due to further eval of chest pain. Patient is aware and verbalizes understanding of plan of care.  Vitals:   11/09/23 1925  BP: 138/89  Pulse: 85  Resp: 16  Temp: 97.7 F (36.5 C)  SpO2: 98%

## 2023-11-09 NOTE — ED Triage Notes (Signed)
 Patient presents to [facility] for constant chest pain since Saturday. States pain worse with movement, SOB when taking a breath in. Has taking tylenol  and ibuprofen with no improvement.

## 2024-05-19 ENCOUNTER — Other Ambulatory Visit: Payer: Self-pay | Admitting: Orthopedic Surgery

## 2024-05-19 DIAGNOSIS — S83242A Other tear of medial meniscus, current injury, left knee, initial encounter: Secondary | ICD-10-CM

## 2024-05-23 ENCOUNTER — Ambulatory Visit
Admission: RE | Admit: 2024-05-23 | Discharge: 2024-05-23 | Disposition: A | Discharge status: Home / Self Care | Source: Ambulatory Visit | Attending: Orthopedic Surgery | Admitting: Orthopedic Surgery

## 2024-05-23 DIAGNOSIS — S83242A Other tear of medial meniscus, current injury, left knee, initial encounter: Secondary | ICD-10-CM | POA: Diagnosis present

## 2024-07-28 ENCOUNTER — Ambulatory Visit: Admitting: Podiatry

## 2024-07-28 DIAGNOSIS — M62462 Contracture of muscle, left lower leg: Secondary | ICD-10-CM | POA: Diagnosis not present

## 2024-07-28 DIAGNOSIS — M722 Plantar fascial fibromatosis: Secondary | ICD-10-CM | POA: Diagnosis not present

## 2024-07-28 NOTE — Progress Notes (Signed)
 "  Subjective:  Patient ID: Tracy Robertson, female    DOB: 1975-12-21,  MRN: [id number]  Chief Complaint  Patient presents with   Foot Pain    Left foot pain, the heel and the outside of her foot pain has been going on for 3-4 weeks some swelling, no recent injuries.     49 y.o. female presents with the above complaint.  Patient presents with complaint left heel pain that has been going on for quite some time has progressive gotten worse worse with ambulation hurts with pressure she would like to discuss treatment options for it.  She walks a lot on her feet.  She was currently wearing shoes.  Denies any other acute complaints   Review of Systems: Negative except as noted in the HPI. Denies N/V/F/Ch.  Past Medical History:  Diagnosis Date   DDD (degenerative disc disease), lumbosacral    DDD (degenerative disc disease), thoracic    Hypertension    Current Medications[1]  Tobacco Use History[2]  Allergies[3] Objective:  There were no vitals filed for this visit. There is no height or weight on file to calculate BMI. Constitutional Well developed. Well nourished.  Vascular Dorsalis pedis pulses palpable bilaterally. Posterior tibial pulses palpable bilaterally. Capillary refill normal to all digits.  No cyanosis or clubbing noted. Pedal hair growth normal.  Neurologic Normal speech. Oriented to person, place, and time. Epicritic sensation to light touch grossly present bilaterally.  Dermatologic Nails well groomed and normal in appearance. No open wounds. No skin lesions.  Orthopedic: Normal joint ROM without pain or crepitus bilaterally. No visible deformities. Tender to palpation at the calcaneal tuber left. No pain with calcaneal squeeze left. Ankle ROM diminished range of motion left. Silfverskiold Test: positive left.   Radiographs: None  Assessment:   1. Plantar fasciitis of left foot   2. Gastrocnemius equinus, left    Plan:  Patient was evaluated and  treated and all questions answered.  Plantar Fasciitis, left with underlying gastrocnemius equinus - XR reviewed as above.  - Educated on icing and stretching. Instructions given.  - Injection delivered to the plantar fascia as below. - DME: Plantar fascial brace dispensed to support the medial longitudinal arch of the foot and offload pressure from the heel and prevent arch collapse during weightbearing - Pharmacologic management: None  Pes cavus/foot deformity -I explained to patient the etiology of pes planovalgus and relationship with heel pain/arch pain and various treatment options were discussed.  Given patient foot structure in the setting of heel pain/arch pain I believe patient will benefit from custom-made orthotics to help control the hindfoot motion support the arch of the foot and take the stress away from arches.  Patient agrees with the plan like to proceed with orthotics - Patient already has orthotics.  She will bring it with her as is not fitting right   Procedure: Injection Tendon/Ligament Location: Left plantar fascia at the glabrous junction; medial approach. Skin Prep: alcohol Injectate: 0.5 cc 0.5% marcaine plain, 0.5 cc of 1% Lidocaine , 0.5 cc kenalog  10. Disposition: Patient tolerated procedure well. Injection site dressed with a band-aid.  No follow-ups on file.    [1]  Current Outpatient Medications:    albuterol (VENTOLIN HFA) 108 (90 Base) MCG/ACT inhaler, Inhale into the lungs., Disp: , Rfl:    estradiol  (ESTRACE ) 0.1 MG/GM vaginal cream, Estrogen Cream Instruction Discard applicator Apply pea sized amount to tip of finger to urethra before bed. Wash hands well after application. Use Monday, Wednesday and  Friday, Disp: 42.5 g, Rfl: 12   fluticasone (FLONASE) 50 MCG/ACT nasal spray, Place into the nose., Disp: , Rfl:    HYDROcodone -acetaminophen  (NORCO/VICODIN) 5-325 MG tablet, Take 1 tablet by mouth daily as needed., Disp: , Rfl:    losartan  (COZAAR ) 25 MG  tablet, Take 25 mg by mouth daily., Disp: , Rfl:    ondansetron  (ZOFRAN -ODT) 4 MG disintegrating tablet, Take 1 tablet (4 mg total) by mouth every 8 (eight) hours as needed., Disp: 20 tablet, Rfl: 0   pantoprazole (PROTONIX) 40 MG tablet, Take 40 mg by mouth 2 (two) times daily., Disp: , Rfl:    propranolol (INDERAL) 10 MG tablet, Take by mouth., Disp: , Rfl:    spironolactone  (ALDACTONE ) 25 MG tablet, Take 25 mg by mouth daily., Disp: , Rfl:  [2]  Social History Tobacco Use  Smoking Status Never  Smokeless Tobacco Never  [3] No Known Allergies  "
